# Patient Record
Sex: Female | Born: 1955
Health system: Southern US, Community
[De-identification: ages and names within clinical notes are randomized; demographics above are authoritative.]

## PROBLEM LIST (undated history)

## (undated) DIAGNOSIS — M79671 Pain in right foot: Principal | ICD-10-CM

## (undated) DIAGNOSIS — I10 Essential (primary) hypertension: Secondary | ICD-10-CM

## (undated) DIAGNOSIS — E785 Hyperlipidemia, unspecified: Secondary | ICD-10-CM

## (undated) DIAGNOSIS — K635 Polyp of colon: Secondary | ICD-10-CM

## (undated) DIAGNOSIS — H3320 Serous retinal detachment, unspecified eye: Secondary | ICD-10-CM

## (undated) DIAGNOSIS — T368X5A Adverse effect of other systemic antibiotics, initial encounter: Secondary | ICD-10-CM

## (undated) DIAGNOSIS — E669 Obesity, unspecified: Secondary | ICD-10-CM

## (undated) DIAGNOSIS — R739 Hyperglycemia, unspecified: Secondary | ICD-10-CM

## (undated) DIAGNOSIS — N179 Acute kidney failure, unspecified: Secondary | ICD-10-CM

## (undated) DIAGNOSIS — N141 Nephropathy induced by other drugs, medicaments and biological substances: Secondary | ICD-10-CM

## (undated) HISTORY — DX: Pain in right foot: M79.671

## (undated) HISTORY — PX: BREAST BIOPSY: SHX20

## (undated) HISTORY — DX: Nephropathy induced by other drugs, medicaments and biological substances: N14.1

## (undated) HISTORY — DX: Adverse effect of other systemic antibiotics, initial encounter: T36.8X5A

## (undated) HISTORY — DX: Obesity, unspecified: E66.9

## (undated) HISTORY — DX: Serous retinal detachment, unspecified eye: H33.20

## (undated) HISTORY — PX: TONSILLECTOMY: SHX5217

## (undated) HISTORY — DX: Acute kidney failure, unspecified: N17.9

## (undated) HISTORY — DX: Polyp of colon: K63.5

## (undated) HISTORY — DX: Hyperlipidemia, unspecified: E78.5

## (undated) HISTORY — DX: Essential (primary) hypertension: I10

## (undated) HISTORY — PX: EYE SURGERY: SHX253

## (undated) HISTORY — DX: Hyperglycemia, unspecified: R73.9

---

## 1998-08-04 ENCOUNTER — Other Ambulatory Visit: Admission: RE | Admit: 1998-08-04 | Discharge: 1998-08-04 | Payer: Self-pay | Admitting: *Deleted

## 1998-09-10 ENCOUNTER — Encounter (INDEPENDENT_AMBULATORY_CARE_PROVIDER_SITE_OTHER): Payer: Self-pay | Admitting: Specialist

## 1998-09-10 ENCOUNTER — Ambulatory Visit (HOSPITAL_COMMUNITY): Admission: RE | Admit: 1998-09-10 | Discharge: 1998-09-10 | Payer: Self-pay | Admitting: Gastroenterology

## 1999-07-11 ENCOUNTER — Encounter: Payer: Self-pay | Admitting: Surgery

## 1999-07-11 ENCOUNTER — Encounter: Admission: RE | Admit: 1999-07-11 | Discharge: 1999-07-11 | Payer: Self-pay | Admitting: Surgery

## 1999-07-11 ENCOUNTER — Encounter (INDEPENDENT_AMBULATORY_CARE_PROVIDER_SITE_OTHER): Payer: Self-pay | Admitting: *Deleted

## 1999-07-11 ENCOUNTER — Other Ambulatory Visit: Admission: RE | Admit: 1999-07-11 | Discharge: 1999-07-11 | Payer: Self-pay | Admitting: Surgery

## 2000-01-05 ENCOUNTER — Encounter: Admission: RE | Admit: 2000-01-05 | Discharge: 2000-01-05 | Payer: Self-pay | Admitting: Family Medicine

## 2000-01-05 ENCOUNTER — Encounter: Payer: Self-pay | Admitting: Family Medicine

## 2000-07-02 ENCOUNTER — Encounter: Payer: Self-pay | Admitting: *Deleted

## 2000-07-02 ENCOUNTER — Encounter: Admission: RE | Admit: 2000-07-02 | Discharge: 2000-07-02 | Payer: Self-pay | Admitting: *Deleted

## 2001-07-05 ENCOUNTER — Encounter: Admission: RE | Admit: 2001-07-05 | Discharge: 2001-07-05 | Payer: Self-pay | Admitting: Family Medicine

## 2001-07-05 ENCOUNTER — Encounter: Payer: Self-pay | Admitting: Family Medicine

## 2001-12-27 ENCOUNTER — Ambulatory Visit (HOSPITAL_COMMUNITY): Admission: RE | Admit: 2001-12-27 | Discharge: 2001-12-27 | Payer: Self-pay | Admitting: Gastroenterology

## 2001-12-27 ENCOUNTER — Encounter (INDEPENDENT_AMBULATORY_CARE_PROVIDER_SITE_OTHER): Payer: Self-pay | Admitting: Specialist

## 2002-07-08 ENCOUNTER — Encounter: Admission: RE | Admit: 2002-07-08 | Discharge: 2002-07-08 | Payer: Self-pay | Admitting: Family Medicine

## 2002-07-08 ENCOUNTER — Encounter: Payer: Self-pay | Admitting: Family Medicine

## 2003-07-09 ENCOUNTER — Encounter: Admission: RE | Admit: 2003-07-09 | Discharge: 2003-07-09 | Payer: Self-pay | Admitting: Family Medicine

## 2003-12-09 ENCOUNTER — Other Ambulatory Visit: Admission: RE | Admit: 2003-12-09 | Discharge: 2003-12-09 | Payer: Self-pay | Admitting: *Deleted

## 2004-07-15 ENCOUNTER — Encounter: Admission: RE | Admit: 2004-07-15 | Discharge: 2004-07-15 | Payer: Self-pay | Admitting: Family Medicine

## 2005-03-17 ENCOUNTER — Other Ambulatory Visit: Admission: RE | Admit: 2005-03-17 | Discharge: 2005-03-17 | Payer: Self-pay | Admitting: *Deleted

## 2005-07-17 ENCOUNTER — Encounter: Admission: RE | Admit: 2005-07-17 | Discharge: 2005-07-17 | Payer: Self-pay | Admitting: Family Medicine

## 2006-05-02 ENCOUNTER — Other Ambulatory Visit: Admission: RE | Admit: 2006-05-02 | Discharge: 2006-05-02 | Payer: Self-pay | Admitting: *Deleted

## 2006-08-01 ENCOUNTER — Encounter: Admission: RE | Admit: 2006-08-01 | Discharge: 2006-08-01 | Payer: Self-pay | Admitting: Family Medicine

## 2007-05-15 LAB — HM COLONOSCOPY

## 2007-08-08 ENCOUNTER — Encounter: Admission: RE | Admit: 2007-08-08 | Discharge: 2007-08-08 | Payer: Self-pay | Admitting: Family Medicine

## 2007-08-08 ENCOUNTER — Other Ambulatory Visit: Admission: RE | Admit: 2007-08-08 | Discharge: 2007-08-08 | Payer: Self-pay | Admitting: Family Medicine

## 2008-06-23 DIAGNOSIS — H3320 Serous retinal detachment, unspecified eye: Secondary | ICD-10-CM

## 2008-06-23 HISTORY — DX: Serous retinal detachment, unspecified eye: H33.20

## 2008-09-09 ENCOUNTER — Encounter: Admission: RE | Admit: 2008-09-09 | Discharge: 2008-09-09 | Payer: Self-pay | Admitting: Family Medicine

## 2008-12-30 ENCOUNTER — Other Ambulatory Visit: Admission: RE | Admit: 2008-12-30 | Discharge: 2008-12-30 | Payer: Self-pay | Admitting: Family Medicine

## 2009-10-29 ENCOUNTER — Encounter: Admission: RE | Admit: 2009-10-29 | Discharge: 2009-10-29 | Payer: Self-pay | Admitting: Family Medicine

## 2010-06-03 NOTE — Op Note (Signed)
   NAME:  Lori Swanson, Lori Swanson                        ACCOUNT NO.:  [id number]   MEDICAL RECORD NO.:  [id number]                   PATIENT TYPE:  AMB   LOCATION:  ENDO                                 FACILITY:  [facility]   PHYSICIAN:  James L. Malon Kindle., M.D.          DATE OF BIRTH:  1955/10/11   DATE OF PROCEDURE:  12/27/2001  DATE OF DISCHARGE:                                 OPERATIVE REPORT   PROCEDURE:  Colonoscopy.   MEDICATIONS:  Fentanyl 80 mcg, Versed 8 mg intravenous.   SCOPE:  Olympus pediatric colonoscope.   INDICATIONS:  Previous history of adenomatous colon polyps.  This is done as  a three-year followup.   DESCRIPTION OF PROCEDURE:  The procedure had been explained to the patient  and consent obtained.  With the patient in the left lateral decubitus  position the scope was inserted and advanced under direct visualization.  The prep was quite good and we were able to advance easily to the cecum.  The ileocecal valve and appendiceal orifice were seen.  The scope was  withdrawn in the cecum, ascending colon, transverse colon, descending and  sigmoid colon were seen well.  In the mid sigmoid colon at 30 centimeters  from the anal verge was a reddened area.  It could have been a slight  colitis and for this reason we went ahead and biopsied.  There was no  significant diverticular disease there.  We did not see any polyps.  The  scope was withdrawn.  The distal sigmoid and rectum were free of polyps.  The patient tolerated the procedure well and was maintained on low flow  oxygen and pulse oximeter throughout the procedure.   ASSESSMENT:  1. No evidence of further colon polyps.  2. Reddened area at the sigmoid, probably insignificant.   PLAN:  We will check pathology report.  Recommend repeating in five years.  Check yearly hemoccults.                                                  James L. Malon Kindle., M.D.    Waldron Session  D:  12/27/2001  T:  12/27/2001  Job:   [id number]   cc:   C. Duane Lope, M.D.  [address]  [place]  [place] [address]  Fax: [phone]

## 2010-12-13 ENCOUNTER — Other Ambulatory Visit: Payer: Self-pay | Admitting: Family Medicine

## 2010-12-13 DIAGNOSIS — Z1231 Encounter for screening mammogram for malignant neoplasm of breast: Secondary | ICD-10-CM

## 2011-01-05 ENCOUNTER — Ambulatory Visit
Admission: RE | Admit: 2011-01-05 | Discharge: 2011-01-05 | Disposition: A | Discharge status: Home / Self Care | Payer: PRIVATE HEALTH INSURANCE | Source: Ambulatory Visit | Attending: Family Medicine | Admitting: Family Medicine

## 2011-01-05 DIAGNOSIS — Z1231 Encounter for screening mammogram for malignant neoplasm of breast: Secondary | ICD-10-CM

## 2011-09-27 LAB — HM PAP SMEAR: HM Pap smear: NORMAL

## 2011-09-29 DIAGNOSIS — Z961 Presence of intraocular lens: Secondary | ICD-10-CM | POA: Insufficient documentation

## 2012-02-14 ENCOUNTER — Other Ambulatory Visit: Payer: Self-pay | Admitting: Family Medicine

## 2012-02-14 DIAGNOSIS — Z1231 Encounter for screening mammogram for malignant neoplasm of breast: Secondary | ICD-10-CM

## 2012-02-29 ENCOUNTER — Ambulatory Visit: Payer: PRIVATE HEALTH INSURANCE

## 2012-03-06 ENCOUNTER — Ambulatory Visit
Admission: RE | Admit: 2012-03-06 | Discharge: 2012-03-06 | Disposition: A | Discharge status: Home / Self Care | Payer: BC Managed Care – PPO | Source: Ambulatory Visit | Attending: Family Medicine | Admitting: Family Medicine

## 2012-03-06 DIAGNOSIS — Z1231 Encounter for screening mammogram for malignant neoplasm of breast: Secondary | ICD-10-CM

## 2012-04-04 ENCOUNTER — Telehealth: Payer: Self-pay | Admitting: Physician Assistant

## 2012-04-04 DIAGNOSIS — E785 Hyperlipidemia, unspecified: Secondary | ICD-10-CM

## 2012-04-04 NOTE — Telephone Encounter (Signed)
Unsure what cholesterol medication patient is on.  Chart says Simvastatin and pt asking for Crestor.  Have left her a mess to call back so we can clarify correct medication.

## 2012-04-05 MED ORDER — SIMVASTATIN 10 MG PO TABS: 10.0000 mg | ORAL_TABLET | Freq: Every day | ORAL | Status: DC

## 2012-04-05 NOTE — Telephone Encounter (Signed)
Patient called back and verified that yes she is taking Simvastatin not Crestor.  Did not have bottle in front of her when she originally called.  Pt told refill will be sent.  Reminded her she is due for follow up visit and lab work.  She is currently out of town and will call for appt when she returns.

## 2012-08-28 ENCOUNTER — Other Ambulatory Visit: Payer: BC Managed Care – PPO

## 2012-08-28 DIAGNOSIS — Z79899 Other long term (current) drug therapy: Secondary | ICD-10-CM

## 2012-08-28 DIAGNOSIS — E785 Hyperlipidemia, unspecified: Secondary | ICD-10-CM

## 2012-08-28 DIAGNOSIS — I10 Essential (primary) hypertension: Secondary | ICD-10-CM

## 2012-08-28 LAB — CBC WITH DIFFERENTIAL/PLATELET
Basophils Absolute: 0 10*3/uL (ref 0.0–0.1)
Basophils Relative: 1 % (ref 0–1)
Eosinophils Absolute: 0.2 10*3/uL (ref 0.0–0.7)
Eosinophils Relative: 4 % (ref 0–5)
HCT: 42.1 % (ref 36.0–46.0)
Hemoglobin: 14.1 g/dL (ref 12.0–15.0)
Lymphocytes Relative: 43 % (ref 12–46)
Lymphs Abs: 1.9 10*3/uL (ref 0.7–4.0)
MCH: 29.7 pg (ref 26.0–34.0)
MCHC: 33.5 g/dL (ref 30.0–36.0)
MCV: 88.6 fL (ref 78.0–100.0)
Monocytes Absolute: 0.4 10*3/uL (ref 0.1–1.0)
Monocytes Relative: 9 % (ref 3–12)
Neutro Abs: 1.9 10*3/uL (ref 1.7–7.7)
Neutrophils Relative %: 43 % (ref 43–77)
Platelets: 311 10*3/uL (ref 150–400)
RBC: 4.75 MIL/uL (ref 3.87–5.11)
RDW: 13.6 % (ref 11.5–15.5)
WBC: 4.3 10*3/uL (ref 4.0–10.5)

## 2012-08-28 LAB — LIPID PANEL
Cholesterol: 200 mg/dL (ref 0–200)
HDL: 39 mg/dL — ABNORMAL LOW (ref >39)
LDL Cholesterol: 117 mg/dL — ABNORMAL HIGH (ref 0–99)
Total CHOL/HDL Ratio: 5.1 Ratio
Triglycerides: 222 mg/dL — ABNORMAL HIGH (ref <150)
VLDL: 44 mg/dL — ABNORMAL HIGH (ref 0–40)

## 2012-08-28 LAB — COMPREHENSIVE METABOLIC PANEL
ALT: 14 U/L (ref 0–35)
AST: 15 U/L (ref 0–37)
Albumin: 4.5 g/dL (ref 3.5–5.2)
Alkaline Phosphatase: 55 U/L (ref 39–117)
BUN: 11 mg/dL (ref 6–23)
CO2: 27 mEq/L (ref 19–32)
Calcium: 9.6 mg/dL (ref 8.4–10.5)
Chloride: 101 mEq/L (ref 96–112)
Creat: 0.72 mg/dL (ref 0.50–1.10)
Glucose, Bld: 113 mg/dL — ABNORMAL HIGH (ref 70–99)
Potassium: 4.1 mEq/L (ref 3.5–5.3)
Sodium: 139 mEq/L (ref 135–145)
Total Bilirubin: 0.7 mg/dL (ref 0.3–1.2)
Total Protein: 6.7 g/dL (ref 6.0–8.3)

## 2012-09-02 ENCOUNTER — Encounter: Payer: Self-pay | Admitting: Physician Assistant

## 2012-09-02 ENCOUNTER — Ambulatory Visit (INDEPENDENT_AMBULATORY_CARE_PROVIDER_SITE_OTHER): Payer: BC Managed Care – PPO | Admitting: Physician Assistant

## 2012-09-02 VITALS — BP 128/80 | HR 68 | Temp 97.7°F | Resp 18 | Ht 61.75 in | Wt 263.0 lb

## 2012-09-02 DIAGNOSIS — R739 Hyperglycemia, unspecified: Secondary | ICD-10-CM | POA: Insufficient documentation

## 2012-09-02 DIAGNOSIS — E669 Obesity, unspecified: Secondary | ICD-10-CM

## 2012-09-02 DIAGNOSIS — E785 Hyperlipidemia, unspecified: Secondary | ICD-10-CM | POA: Insufficient documentation

## 2012-09-02 DIAGNOSIS — R7309 Other abnormal glucose: Secondary | ICD-10-CM

## 2012-09-02 DIAGNOSIS — I10 Essential (primary) hypertension: Secondary | ICD-10-CM | POA: Insufficient documentation

## 2012-09-02 MED ORDER — VALSARTAN-HYDROCHLOROTHIAZIDE 320-25 MG PO TABS: 1.0000 | ORAL_TABLET | Freq: Every day | ORAL | Status: DC

## 2012-09-02 MED ORDER — SIMVASTATIN 10 MG PO TABS: 10.0000 mg | ORAL_TABLET | Freq: Every day | ORAL | Status: DC

## 2012-09-02 NOTE — Progress Notes (Signed)
Patient ID: Lori Swanson MRN: [id number], DOB: Dec 23, 1955, 57 y.o. Date of Encounter: @DATE @  Chief Complaint:  Chief Complaint  Patient presents with  . routine check up for med refills    HPI: 57 y.o. year old white female  presents for routine f/u OV. She came last week and had labs done. She has stopped her chol med 6 weeks ago b/c left leg was feeling stiff. No other areas of myalgias-just left leg.  She has been doing [employer] for one month.  Still exercising-3 days/ week-exercises at 5 a.m. -walks or goes to gym.  No other complaints. Taking BP med as directed. No adv effects.    Past Medical History  Diagnosis Date  . Hyperlipidemia   . Hypertension   . Obesity   . Hyperglycemia      Home Meds: See attached medication section for current medication list. Any medications entered into computer today will not appear on this note's list. The medications listed below were entered prior to today. Current Outpatient Prescriptions on File Prior to Visit  Medication Sig Dispense Refill  . Ascorbic Acid (VITAMIN C) 1000 MG tablet Take 1,000 mg by mouth daily.      . Multiple Vitamin (MULTIVITAMIN) capsule Take 1 capsule by mouth daily. Takes a Centrum MVI daily      . Omega-3 Fatty Acids (FISH OIL) 1200 MG CAPS Take 2 capsules by mouth every morning.      . vitamin E 400 UNIT capsule Take 400 Units by mouth daily.      Marland Kitchen UNABLE TO FIND Fluoromethrolone 0.1% eye drops       No current facility-administered medications on file prior to visit.    Allergies:  Allergies  Allergen Reactions  . Iodine Swelling    History   Social History  . Marital Status: Single    Spouse Name: N/A    Number of Children: N/A  . Years of Education: N/A   Occupational History  . Not on file.   Social History Main Topics  . Smoking status: Never Smoker   . Smokeless tobacco: Never Used  . Alcohol Use: No  . Drug Use: No  . Sexual Activity: Not on file   Other Topics Concern   . Not on file   Social History Narrative  . No narrative on file    History reviewed. No pertinent family history.   Review of Systems:  See HPI for pertinent ROS. All other ROS negative.    Physical Exam: Blood pressure 128/80, pulse 68, temperature 97.7 F (36.5 C), temperature source Oral, resp. rate 18, height 5' 1.75" (1.568 m), weight 263 lb (119.296 kg)., Body mass index is 48.52 kg/(m^2). General: [name]. Appears in no acute distress. Neck: Supple. No thyromegaly. No lymphadenopathy.No carotid bruits. Lungs: Clear bilaterally to auscultation without wheezes, rales, or rhonchi. Breathing is unlabored. Heart: RRR with S1 S2. No murmurs, rubs, or gallops. Abdomen: Soft, non-tender, non-distended with normoactive bowel sounds. No hepatomegaly. No rebound/guarding. No obvious abdominal masses. Musculoskeletal:  Strength and tone normal for age. Extremities/Skin: Warm and dry. No edema. Neuro: Alert and oriented X 3. Moves all extremities spontaneously. Gait is normal. CNII-XII grossly in tact. Psych:  Responds to questions appropriately with a normal affect.   Results for orders placed in visit on 08/28/12  CBC WITH DIFFERENTIAL      Result Value Range   WBC 4.3  4.0 - 10.5 K/uL   RBC 4.75  3.87 - 5.11 MIL/uL  Hemoglobin 14.1  12.0 - 15.0 g/dL   HCT 11.9  14.7 - 82.9 %   MCV 88.6  78.0 - 100.0 fL   MCH 29.7  26.0 - 34.0 pg   MCHC 33.5  30.0 - 36.0 g/dL   RDW 56.2  13.0 - 86.5 %   Platelets 311  150 - 400 K/uL   Neutrophils Relative % 43  43 - 77 %   Neutro Abs 1.9  1.7 - 7.7 K/uL   Lymphocytes Relative 43  12 - 46 %   Lymphs Abs 1.9  0.7 - 4.0 K/uL   Monocytes Relative 9  3 - 12 %   Monocytes Absolute 0.4  0.1 - 1.0 K/uL   Eosinophils Relative 4  0 - 5 %   Eosinophils Absolute 0.2  0.0 - 0.7 K/uL   Basophils Relative 1  0 - 1 %   Basophils Absolute 0.0  0.0 - 0.1 K/uL   Smear Review Criteria for review not met    COMPREHENSIVE METABOLIC PANEL      Result Value  Range   Sodium 139  135 - 145 mEq/L   Potassium 4.1  3.5 - 5.3 mEq/L   Chloride 101  96 - 112 mEq/L   CO2 27  19 - 32 mEq/L   Glucose, Bld 113 (*) 70 - 99 mg/dL   BUN 11  6 - 23 mg/dL   Creat 7.84  6.96 - 2.95 mg/dL   Total Bilirubin 0.7  0.3 - 1.2 mg/dL   Alkaline Phosphatase 55  39 - 117 U/L   AST 15  0 - 37 U/L   ALT 14  0 - 35 U/L   Total Protein 6.7  6.0 - 8.3 g/dL   Albumin 4.5  3.5 - 5.2 g/dL   Calcium 9.6  8.4 - 28.4 mg/dL  LIPID PANEL      Result Value Range   Cholesterol 200  0 - 200 mg/dL   Triglycerides 132 (*) <150 mg/dL   HDL 39 (*) >44 mg/dL   Total CHOL/HDL Ratio 5.1     VLDL 44 (*) 0 - 40 mg/dL   LDL Cholesterol 010 (*) 0 - 99 mg/dL     ASSESSMENT AND PLAN:  57 y.o. year old female with  1. Hyperglycemia Fasting Glucose above 113, stable from prior checks. Cont [employer]. THis should improve her BS.  2. Hyperlipidemia Discussed that cholesterol not that high, even off med and that [employer] should improve her cholesterol. However, we discussed that I'm not sure the Simvastatin was the cause of her left leg stiffness. She wants to try med again. IF she develops myalgias, she is to stop med and call usso can document this.  - simvastatin (ZOCOR) 10 MG tablet; Take 1 tablet (10 mg total) by mouth at bedtime.  Dispense: 90 tablet; Refill: 1  3. Hypertension At Goal. Cont current med.  - valsartan-hydrochlorothiazide (DIOVAN-HCT) 320-25 MG per tablet; Take 1 tablet by mouth daily.  Dispense: 30 tablet; Refill: 5  4. Obesity Cont Wt. Watchers and exercise.   PREVENTIVE CARE: SEE CPE 09/2011.  [address] [place], [place], [facility] 09/02/2012 8:37 AM

## 2012-09-05 ENCOUNTER — Encounter: Payer: Self-pay | Admitting: Family Medicine

## 2013-02-21 ENCOUNTER — Other Ambulatory Visit: Payer: Self-pay | Admitting: Physician Assistant

## 2013-02-21 NOTE — Telephone Encounter (Signed)
CPE later this month.  One month refills sent

## 2013-03-06 ENCOUNTER — Other Ambulatory Visit: Payer: BC Managed Care – PPO | Admitting: Physician Assistant

## 2013-03-24 ENCOUNTER — Other Ambulatory Visit: Payer: Self-pay | Admitting: Physician Assistant

## 2013-03-24 DIAGNOSIS — I10 Essential (primary) hypertension: Secondary | ICD-10-CM

## 2013-03-24 DIAGNOSIS — E785 Hyperlipidemia, unspecified: Secondary | ICD-10-CM

## 2013-03-25 ENCOUNTER — Encounter: Payer: Self-pay | Admitting: Family Medicine

## 2013-03-25 NOTE — Telephone Encounter (Signed)
Medication refill for one time only.  Patient needs to be seen.  Letter sent for patient to call and schedule 

## 2013-04-29 ENCOUNTER — Encounter: Payer: Self-pay | Admitting: Family Medicine

## 2013-04-29 ENCOUNTER — Other Ambulatory Visit: Payer: BC Managed Care – PPO

## 2013-04-29 DIAGNOSIS — Z79899 Other long term (current) drug therapy: Secondary | ICD-10-CM

## 2013-04-29 DIAGNOSIS — R739 Hyperglycemia, unspecified: Secondary | ICD-10-CM

## 2013-04-29 DIAGNOSIS — Z0000 Encounter for general adult medical examination without abnormal findings: Secondary | ICD-10-CM

## 2013-04-29 DIAGNOSIS — E785 Hyperlipidemia, unspecified: Secondary | ICD-10-CM

## 2013-04-29 LAB — LIPID PANEL
Cholesterol: 162 mg/dL (ref 0–200)
HDL: 37 mg/dL — ABNORMAL LOW (ref >39)
LDL Cholesterol: 89 mg/dL (ref 0–99)
Total CHOL/HDL Ratio: 4.4 Ratio
Triglycerides: 178 mg/dL — ABNORMAL HIGH (ref <150)
VLDL: 36 mg/dL (ref 0–40)

## 2013-04-29 LAB — CBC WITH DIFFERENTIAL/PLATELET
Basophils Absolute: 0.1 10*3/uL (ref 0.0–0.1)
Basophils Relative: 1 % (ref 0–1)
Eosinophils Absolute: 0.2 10*3/uL (ref 0.0–0.7)
Eosinophils Relative: 2 % (ref 0–5)
HCT: 41.7 % (ref 36.0–46.0)
Hemoglobin: 14 g/dL (ref 12.0–15.0)
Lymphocytes Relative: 23 % (ref 12–46)
Lymphs Abs: 2 10*3/uL (ref 0.7–4.0)
MCH: 30 pg (ref 26.0–34.0)
MCHC: 33.6 g/dL (ref 30.0–36.0)
MCV: 89.5 fL (ref 78.0–100.0)
Monocytes Absolute: 0.5 10*3/uL (ref 0.1–1.0)
Monocytes Relative: 6 % (ref 3–12)
Neutro Abs: 5.8 10*3/uL (ref 1.7–7.7)
Neutrophils Relative %: 68 % (ref 43–77)
Platelets: 292 10*3/uL (ref 150–400)
RBC: 4.66 MIL/uL (ref 3.87–5.11)
RDW: 13.7 % (ref 11.5–15.5)
WBC: 8.5 10*3/uL (ref 4.0–10.5)

## 2013-04-29 LAB — COMPLETE METABOLIC PANEL WITH GFR
ALT: 14 U/L (ref 0–35)
AST: 15 U/L (ref 0–37)
Albumin: 4.3 g/dL (ref 3.5–5.2)
Alkaline Phosphatase: 52 U/L (ref 39–117)
BUN: 13 mg/dL (ref 6–23)
CO2: 26 mEq/L (ref 19–32)
Calcium: 9.4 mg/dL (ref 8.4–10.5)
Chloride: 103 mEq/L (ref 96–112)
Creat: 0.66 mg/dL (ref 0.50–1.10)
GFR, Est African American: >89 mL/min
GFR, Est Non African American: >89 mL/min
Glucose, Bld: 112 mg/dL — ABNORMAL HIGH (ref 70–99)
Potassium: 4.2 mEq/L (ref 3.5–5.3)
Sodium: 139 mEq/L (ref 135–145)
Total Bilirubin: 0.6 mg/dL (ref 0.2–1.2)
Total Protein: 6.5 g/dL (ref 6.0–8.3)

## 2013-04-29 LAB — HEMOGLOBIN A1C
Hgb A1c MFr Bld: 6 % — ABNORMAL HIGH (ref <5.7)
Mean Plasma Glucose: 126 mg/dL — ABNORMAL HIGH (ref <117)

## 2013-05-01 ENCOUNTER — Encounter: Payer: Self-pay | Admitting: Physician Assistant

## 2013-05-01 ENCOUNTER — Ambulatory Visit (INDEPENDENT_AMBULATORY_CARE_PROVIDER_SITE_OTHER): Payer: BC Managed Care – PPO | Admitting: Physician Assistant

## 2013-05-01 ENCOUNTER — Other Ambulatory Visit: Payer: Self-pay | Admitting: Family Medicine

## 2013-05-01 VITALS — BP 142/84 | HR 64 | Temp 97.9°F | Resp 18 | Ht 62.75 in | Wt 269.0 lb

## 2013-05-01 DIAGNOSIS — Z79899 Other long term (current) drug therapy: Secondary | ICD-10-CM

## 2013-05-01 DIAGNOSIS — E785 Hyperlipidemia, unspecified: Secondary | ICD-10-CM

## 2013-05-01 DIAGNOSIS — I10 Essential (primary) hypertension: Secondary | ICD-10-CM

## 2013-05-01 DIAGNOSIS — R7309 Other abnormal glucose: Secondary | ICD-10-CM

## 2013-05-01 DIAGNOSIS — E669 Obesity, unspecified: Secondary | ICD-10-CM

## 2013-05-01 DIAGNOSIS — R739 Hyperglycemia, unspecified: Secondary | ICD-10-CM

## 2013-05-01 MED ORDER — VALSARTAN-HYDROCHLOROTHIAZIDE 320-25 MG PO TABS: 1.0000 | ORAL_TABLET | Freq: Every day | ORAL | Status: DC

## 2013-05-01 MED ORDER — SIMVASTATIN 10 MG PO TABS: 10.0000 mg | ORAL_TABLET | Freq: Every day | ORAL | Status: DC

## 2013-05-01 NOTE — Progress Notes (Signed)
Patient ID: Kayleen MemosCarrie E Voong MRN: [id number], DOB: January 09, 1956, 58 y.o. Date of Encounter: @DATE @  Chief Complaint:  Chief Complaint  Patient presents with  . 6 mth check up/med RF    has had labs    HPI: 58 y.o. year old white female  presents for routine f/u OV. She came last week and had labs done.    At the last office visit 09/02/12 she reported that she had been doing [employer] for one month.  Today she states that she continued that for about 3 months. And had to stop it secondary to finances. Says that she feels like she has continued a lot of the changes that she had learned and developed while doing the [employer]. States that she thinks her weight is up a little bit today just secondary to the winter. Says that she feels like with sprain here she will lose some weight and it back in line with that.  At her last visit 08/2012 she was exercising 3 days/ week-exercises at 5 a.m. -walks or goes to gym.  ToDay she reports that she is still exercising 3 days per week but is doing it in the evenings instead of the mornings. Still, she either walks outside or goes to the gym.  No  complaints. Taking BP med  And chol med as directed. No adv effects.    Past Medical History  Diagnosis Date  . Hyperlipidemia   . Hypertension   . Obesity   . Hyperglycemia   . Detached retina 06/23/2008  . Colon polyps      Home Meds: See attached medication section for current medication list. Any medications entered into computer today will not appear on this note's list. The medications listed below were entered prior to today. Current Outpatient Prescriptions on File Prior to Visit  Medication Sig Dispense Refill  . Ascorbic Acid (VITAMIN C) 1000 MG tablet Take 1,000 mg by mouth daily.      Marland Kitchen. aspirin 81 MG tablet Take 81 mg by mouth daily.      . Multiple Vitamin (MULTIVITAMIN) capsule Take 1 capsule by mouth daily. Takes a Centrum MVI daily      . Omega-3 Fatty Acids (FISH OIL) 1200 MG  CAPS Take 2 capsules by mouth every morning.      . simvastatin (ZOCOR) 10 MG tablet TAKE 1 TABLET AT BEDTIME  30 tablet  0  . valsartan-hydrochlorothiazide (DIOVAN-HCT) 320-25 MG per tablet TAKE 1 TABLET BY MOUTH EVERY DAY  30 tablet  0  . vitamin E 400 UNIT capsule Take 400 Units by mouth daily.       No current facility-administered medications on file prior to visit.    Allergies:  Allergies  Allergen Reactions  . Iodine Swelling    History   Social History  . Marital Status: Single    Spouse Name: N/A    Number of Children: N/A  . Years of Education: N/A   Occupational History  . Not on file.   Social History Main Topics  . Smoking status: Never Smoker   . Smokeless tobacco: Never Used  . Alcohol Use: No  . Drug Use: No  . Sexual Activity: Not on file   Other Topics Concern  . Not on file   Social History Narrative  . No narrative on file    Family History  Problem Relation Age of Onset  . Diabetes Mother   . Heart disease Mother     MI, CAD  .  Cancer Mother     breast  . Hypertension Mother   . Cancer Maternal Uncle     throat  . Cancer Maternal Uncle     bone  . Cancer Father     lung  . Hypertension Father   . Heart disease Father     pacemaker, MI in 8050's     Review of Systems:  See HPI for pertinent ROS. All other ROS negative.    Physical Exam: Blood pressure 142/84, pulse 64, temperature 97.9 F (36.6 C), temperature source Oral, resp. rate 18, height 5' 2.75" (1.594 m), weight 269 lb (122.018 kg)., Body mass index is 48.02 kg/(m^2). General: [name]. Appears in no acute distress. Neck: Supple. No thyromegaly. No lymphadenopathy.No carotid bruits. Lungs: Clear bilaterally to auscultation without wheezes, rales, or rhonchi. Breathing is unlabored. Heart: RRR with S1 S2. No murmurs, rubs, or gallops. Abdomen: Soft, non-tender, non-distended with normoactive bowel sounds. No hepatomegaly. No rebound/guarding. No obvious abdominal  masses. Musculoskeletal:  Strength and tone normal for age. Extremities/Skin: Warm and dry. No edema. Neuro: Alert and oriented X 3. Moves all extremities spontaneously. Gait is normal. CNII-XII grossly in tact. Psych:  Responds to questions appropriately with a normal affect.   Results for orders placed in visit on 04/29/13  HM PAP SMEAR      Result Value Ref Range   HM Pap smear normal    HM COLONOSCOPY      Result Value Ref Range   HM Colonoscopy polyps       ASSESSMENT AND PLAN:  58 y.o. year old female with  1. Hyperglycemia SHe recently came and have fasting labs drawn.   A1C is 6.0. Continue diet and exercise to control this.  2. Hyperlipidemia She recently had FLP/ LFT. This was excellent. LDL is at 89 which is at goal. LFTs normal. Continue current cholesterol medication recheck 6 months.  3. Hypertension At Goal. Cont current med.  - valsartan-hydrochlorothiazide (DIOVAN-HCT) 320-25 MG per tablet; Take 1 tablet by mouth daily.  Dispense: 30 tablet; Refill: 5  4. Obesity Cont Diet and exercise.   PREVENTIVE CARE: SEE CPE 09/2011.  [address]igned, [place], [place], [facility] 05/01/2013 9:47 AM

## 2013-05-31 ENCOUNTER — Other Ambulatory Visit: Payer: Self-pay | Admitting: Physician Assistant

## 2013-06-02 NOTE — Telephone Encounter (Signed)
Medication refilled per protocol. 

## 2014-05-07 NOTE — Progress Notes (Signed)
Pt showing up in my [employer] Results Folder.  LOV 04/2013.  Call pt and have her schedule follow-up office visit. If possible, come fasting to that visit.

## 2014-05-29 ENCOUNTER — Other Ambulatory Visit: Payer: Self-pay | Admitting: Physician Assistant

## 2014-05-29 ENCOUNTER — Encounter: Payer: Self-pay | Admitting: Family Medicine

## 2014-05-29 NOTE — Telephone Encounter (Signed)
Pt has not been seen in over one year. Medication refill for one time only.  Patient needs to be seen.  Letter sent for patient to call and schedule 

## 2014-06-26 ENCOUNTER — Other Ambulatory Visit: Payer: Self-pay | Admitting: Physician Assistant

## 2014-07-09 ENCOUNTER — Encounter: Payer: Self-pay | Admitting: Physician Assistant

## 2014-07-09 ENCOUNTER — Ambulatory Visit (INDEPENDENT_AMBULATORY_CARE_PROVIDER_SITE_OTHER): Payer: BLUE CROSS/BLUE SHIELD | Admitting: Physician Assistant

## 2014-07-09 VITALS — BP 138/88 | HR 100 | Temp 97.4°F | Resp 20 | Wt 274.0 lb

## 2014-07-09 DIAGNOSIS — M25474 Effusion, right foot: Secondary | ICD-10-CM

## 2014-07-09 DIAGNOSIS — M7989 Other specified soft tissue disorders: Secondary | ICD-10-CM

## 2014-07-09 DIAGNOSIS — I10 Essential (primary) hypertension: Secondary | ICD-10-CM | POA: Diagnosis not present

## 2014-07-09 DIAGNOSIS — E669 Obesity, unspecified: Secondary | ICD-10-CM | POA: Diagnosis not present

## 2014-07-09 DIAGNOSIS — R739 Hyperglycemia, unspecified: Secondary | ICD-10-CM

## 2014-07-09 DIAGNOSIS — E785 Hyperlipidemia, unspecified: Secondary | ICD-10-CM | POA: Diagnosis not present

## 2014-07-09 LAB — CBC WITH DIFFERENTIAL/PLATELET
Basophils Absolute: 0 10*3/uL (ref 0.0–0.1)
Basophils Relative: 0 % (ref 0–1)
Eosinophils Absolute: 0.1 10*3/uL (ref 0.0–0.7)
Eosinophils Relative: 1 % (ref 0–5)
HCT: 37.1 % (ref 36.0–46.0)
Hemoglobin: 12.2 g/dL (ref 12.0–15.0)
Lymphocytes Relative: 15 % (ref 12–46)
Lymphs Abs: 1.7 10*3/uL (ref 0.7–4.0)
MCH: 29.3 pg (ref 26.0–34.0)
MCHC: 32.9 g/dL (ref 30.0–36.0)
MCV: 89.2 fL (ref 78.0–100.0)
MPV: 8.7 fL (ref 8.6–12.4)
Monocytes Absolute: 0.7 10*3/uL (ref 0.1–1.0)
Monocytes Relative: 6 % (ref 3–12)
Neutro Abs: 9 10*3/uL — ABNORMAL HIGH (ref 1.7–7.7)
Neutrophils Relative %: 78 % — ABNORMAL HIGH (ref 43–77)
Platelets: 515 10*3/uL — ABNORMAL HIGH (ref 150–400)
RBC: 4.16 MIL/uL (ref 3.87–5.11)
RDW: 14.1 % (ref 11.5–15.5)
WBC: 11.6 10*3/uL — ABNORMAL HIGH (ref 4.0–10.5)

## 2014-07-09 LAB — COMPLETE METABOLIC PANEL WITH GFR
ALT: 20 U/L (ref 0–35)
AST: 13 U/L (ref 0–37)
Albumin: 3.2 g/dL — ABNORMAL LOW (ref 3.5–5.2)
Alkaline Phosphatase: 178 U/L — ABNORMAL HIGH (ref 39–117)
BUN: 13 mg/dL (ref 6–23)
CO2: 30 mEq/L (ref 19–32)
Calcium: 9 mg/dL (ref 8.4–10.5)
Chloride: 95 mEq/L — ABNORMAL LOW (ref 96–112)
Creat: 0.49 mg/dL — ABNORMAL LOW (ref 0.50–1.10)
GFR, Est African American: >89 mL/min
GFR, Est Non African American: >89 mL/min
Glucose, Bld: 115 mg/dL — ABNORMAL HIGH (ref 70–99)
Potassium: 3.5 mEq/L (ref 3.5–5.3)
Sodium: 140 mEq/L (ref 135–145)
Total Bilirubin: 0.5 mg/dL (ref 0.2–1.2)
Total Protein: 6.4 g/dL (ref 6.0–8.3)

## 2014-07-09 LAB — HEMOGLOBIN A1C
Hgb A1c MFr Bld: 6.2 % — ABNORMAL HIGH (ref <5.7)
Mean Plasma Glucose: 131 mg/dL — ABNORMAL HIGH (ref <117)

## 2014-07-09 LAB — LIPID PANEL
Cholesterol: 158 mg/dL (ref 0–200)
HDL: 22 mg/dL — ABNORMAL LOW (ref >=46)
LDL Cholesterol: 109 mg/dL — ABNORMAL HIGH (ref 0–99)
Total CHOL/HDL Ratio: 7.2 Ratio
Triglycerides: 134 mg/dL (ref <150)
VLDL: 27 mg/dL (ref 0–40)

## 2014-07-09 LAB — URIC ACID: Uric Acid, Serum: 4.3 mg/dL (ref 2.4–7.0)
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
## 2014-07-10 ENCOUNTER — Telehealth: Payer: Self-pay | Admitting: Family Medicine

## 2014-07-10 MED ORDER — CEPHALEXIN 500 MG PO CAPS: 500.0000 mg | ORAL_CAPSULE | Freq: Four times a day (QID) | ORAL | Status: DC

## 2014-07-10 NOTE — Telephone Encounter (Signed)
-----   Message from Dorena Bodo, PA-C sent at 07/10/2014 12:46 PM EDT ----- Lori Swanson patient was sent for venous ultrasound and x-ray. [name] did tell me that they called and said that the ultrasound was negative for DVT but she did not have the x-ray report.. Please obtain XRay report. IF XRay shows no abnormality, THEN treat with Keflex 500mg  1 po QID x 7 days #28 + 0 to treat as cellulitis (WBC and Neutrophils high). Tell pt to follow-up if foot worsens or is not improving after taking antibiotic for 48 hours. Call me on my cell if XRay shows ANYTHING.  Pt not been in for routine office visit in a long time. Tell her that those labs are stable right now but she needs to schedule a follow-up office visit to address these other issues further.

## 2014-07-10 NOTE — Telephone Encounter (Signed)
Pt aware of lab and imaging results.  Rx to pharmacy.  To call on Monday if foot not improving.  Reminded to call and schedule routine visit as well.

## 2014-07-14 ENCOUNTER — Inpatient Hospital Stay (HOSPITAL_COMMUNITY)
Admission: EM | Admit: 2014-07-14 | Discharge: 2014-07-16 | DRG: 552 | Disposition: A | Discharge status: Home / Self Care | Payer: BLUE CROSS/BLUE SHIELD | Attending: Orthopedic Surgery | Admitting: Orthopedic Surgery

## 2014-07-14 ENCOUNTER — Encounter (HOSPITAL_COMMUNITY): Payer: Self-pay | Admitting: Emergency Medicine

## 2014-07-14 DIAGNOSIS — Z7982 Long term (current) use of aspirin: Secondary | ICD-10-CM | POA: Diagnosis not present

## 2014-07-14 DIAGNOSIS — Z6841 Body Mass Index (BMI) 40.0 and over, adult: Secondary | ICD-10-CM | POA: Diagnosis not present

## 2014-07-14 DIAGNOSIS — E785 Hyperlipidemia, unspecified: Secondary | ICD-10-CM | POA: Diagnosis present

## 2014-07-14 DIAGNOSIS — L03115 Cellulitis of right lower limb: Secondary | ICD-10-CM | POA: Diagnosis not present

## 2014-07-14 DIAGNOSIS — M4642 Discitis, unspecified, cervical region: Secondary | ICD-10-CM | POA: Diagnosis present

## 2014-07-14 DIAGNOSIS — L03116 Cellulitis of left lower limb: Secondary | ICD-10-CM | POA: Diagnosis present

## 2014-07-14 DIAGNOSIS — I10 Essential (primary) hypertension: Secondary | ICD-10-CM | POA: Diagnosis present

## 2014-07-14 DIAGNOSIS — Z86010 Personal history of colonic polyps: Secondary | ICD-10-CM | POA: Diagnosis not present

## 2014-07-14 DIAGNOSIS — B9689 Other specified bacterial agents as the cause of diseases classified elsewhere: Secondary | ICD-10-CM | POA: Diagnosis not present

## 2014-07-14 LAB — COMPREHENSIVE METABOLIC PANEL
ALT: 26 U/L (ref 14–54)
AST: 15 U/L (ref 15–41)
Albumin: 3 g/dL — ABNORMAL LOW (ref 3.5–5.0)
Alkaline Phosphatase: 120 U/L (ref 38–126)
Anion gap: 7 (ref 5–15)
BUN: 15 mg/dL (ref 6–20)
CO2: 27 mmol/L (ref 22–32)
Calcium: 9.2 mg/dL (ref 8.9–10.3)
Chloride: 103 mmol/L (ref 101–111)
Creatinine, Ser: 0.71 mg/dL (ref 0.44–1.00)
GFR calc Af Amer: >60 mL/min (ref >60)
GFR calc non Af Amer: >60 mL/min (ref >60)
Glucose, Bld: 104 mg/dL — ABNORMAL HIGH (ref 65–99)
Potassium: 4.2 mmol/L (ref 3.5–5.1)
Sodium: 137 mmol/L (ref 135–145)
Total Bilirubin: 0.5 mg/dL (ref 0.3–1.2)
Total Protein: 7.3 g/dL (ref 6.5–8.1)

## 2014-07-14 LAB — CBC WITH DIFFERENTIAL/PLATELET
Basophils Absolute: 0 10*3/uL (ref 0.0–0.1)
Basophils Relative: 0 % (ref 0–1)
Eosinophils Absolute: 0.2 10*3/uL (ref 0.0–0.7)
Eosinophils Relative: 2 % (ref 0–5)
HCT: 38 % (ref 36.0–46.0)
Hemoglobin: 12.5 g/dL (ref 12.0–15.0)
Lymphocytes Relative: 22 % (ref 12–46)
Lymphs Abs: 2.1 10*3/uL (ref 0.7–4.0)
MCH: 29.8 pg (ref 26.0–34.0)
MCHC: 32.9 g/dL (ref 30.0–36.0)
MCV: 90.5 fL (ref 78.0–100.0)
Monocytes Absolute: 0.6 10*3/uL (ref 0.1–1.0)
Monocytes Relative: 7 % (ref 3–12)
Neutro Abs: 6.4 10*3/uL (ref 1.7–7.7)
Neutrophils Relative %: 69 % (ref 43–77)
Platelets: 547 10*3/uL — ABNORMAL HIGH (ref 150–400)
RBC: 4.2 MIL/uL (ref 3.87–5.11)
RDW: 13.5 % (ref 11.5–15.5)
WBC: 9.3 10*3/uL (ref 4.0–10.5)

## 2014-07-14 MED ORDER — SODIUM CHLORIDE 0.9 % IV SOLN: 250.0000 mL | INTRAVENOUS | Status: DC | PRN

## 2014-07-14 MED ORDER — ONDANSETRON HCL 4 MG PO TABS
4.0000 mg | ORAL_TABLET | Freq: Four times a day (QID) | ORAL | Status: DC | PRN
Start: 2014-07-14 — End: 2014-07-16

## 2014-07-14 MED ORDER — MORPHINE SULFATE 2 MG/ML IJ SOLN: 2.0000 mg | INTRAMUSCULAR | Status: DC | PRN

## 2014-07-14 MED ORDER — ONDANSETRON HCL 4 MG/2ML IJ SOLN: 4.0000 mg | Freq: Four times a day (QID) | INTRAMUSCULAR | Status: DC | PRN

## 2014-07-14 MED ORDER — ASPIRIN EC 81 MG PO TBEC
81.0000 mg | DELAYED_RELEASE_TABLET | Freq: Every day | ORAL | Status: DC
  Administered 2014-07-15 – 2014-07-16 (×2): 81 mg via ORAL
  Filled 2014-07-14 (×2): qty 1

## 2014-07-14 MED ORDER — OXYCODONE HCL 5 MG PO TABS
5.0000 mg | ORAL_TABLET | ORAL | Status: DC | PRN
  Administered 2014-07-15: 5 mg via ORAL
  Filled 2014-07-14: qty 1

## 2014-07-14 MED ORDER — SODIUM CHLORIDE 0.9 % IJ SOLN
3.0000 mL | Freq: Two times a day (BID) | INTRAMUSCULAR | Status: DC
  Administered 2014-07-15 (×3): 3 mL via INTRAVENOUS

## 2014-07-14 MED ORDER — CEFAZOLIN SODIUM-DEXTROSE 2-3 GM-% IV SOLR
2.0000 g | Freq: Three times a day (TID) | INTRAVENOUS | Status: DC
  Administered 2014-07-15 (×2): 2 g via INTRAVENOUS
  Filled 2014-07-14 (×4): qty 50

## 2014-07-14 MED ORDER — METHOCARBAMOL 500 MG PO TABS
500.0000 mg | ORAL_TABLET | Freq: Three times a day (TID) | ORAL | Status: DC | PRN
  Administered 2014-07-15 (×2): 500 mg via ORAL
  Filled 2014-07-14 (×2): qty 1

## 2014-07-14 MED ORDER — POLYETHYLENE GLYCOL 3350 17 G PO PACK: 17.0000 g | PACK | Freq: Every day | ORAL | Status: DC | PRN

## 2014-07-14 MED ORDER — SODIUM CHLORIDE 0.9 % IJ SOLN: 3.0000 mL | INTRAMUSCULAR | Status: DC | PRN

## 2014-07-14 NOTE — ED Notes (Signed)
Brooks MD at bedside

## 2014-07-14 NOTE — ED Provider Notes (Signed)
CSN: [id number]     Arrival date & time 07/14/14  1747 History   First MD Initiated Contact with Patient 07/14/14 2143     Chief Complaint  Patient presents with  . Back Pain     (Consider location/radiation/quality/duration/timing/severity/associated sxs/prior Treatment) Patient is a 59 y.o. female presenting with back pain. The history is provided by the patient (the the pt has a hx of upper back pain.  she had an mri at the ortho office and she was called today and told to come to the hospital).  Back Pain Pain location: upper back. Radiates to:  Does not radiate Pain severity:  Mild Pain is:  Same all the time Onset quality:  Gradual Timing:  Constant Progression:  Unchanged Chronicity:  New Context: not emotional stress   Associated symptoms: no abdominal pain, no chest pain and no headaches     Past Medical History  Diagnosis Date  . Hyperlipidemia   . Hypertension   . Obesity   . Hyperglycemia   . Detached retina 06/23/2008  . Colon polyps    Past Surgical History  Procedure Laterality Date  . Breast biopsy Left     benign  . Eye surgery Left     cataract  . Tonsillectomy     Family History  Problem Relation Age of Onset  . Diabetes Mother   . Heart disease Mother     MI, CAD  . Cancer Mother     breast  . Hypertension Mother   . Cancer Maternal Uncle     throat  . Cancer Maternal Uncle     bone  . Cancer Father     lung  . Hypertension Father   . Heart disease Father     pacemaker, MI in 7850's   History  Substance Use Topics  . Smoking status: Never Smoker   . Smokeless tobacco: Never Used  . Alcohol Use: No   OB History    No data available     Review of Systems  Constitutional: Negative for appetite change and fatigue.  HENT: Negative for congestion, ear discharge and sinus pressure.   Eyes: Negative for discharge.  Respiratory: Negative for cough.   Cardiovascular: Negative for chest pain.  Gastrointestinal: Negative for abdominal  pain and diarrhea.  Genitourinary: Negative for frequency and hematuria.  Musculoskeletal: Positive for back pain.  Skin: Negative for rash.  Neurological: Negative for seizures and headaches.  Psychiatric/Behavioral: Negative for hallucinations.      Allergies  Iodine  Home Medications   Prior to Admission medications   Medication Sig Start Date End Date Taking? Authorizing Provider  Ascorbic Acid (VITAMIN C) 1000 MG tablet Take 1,000 mg by mouth daily.    Historical Provider, MD  aspirin 81 MG tablet Take 81 mg by mouth daily.    Historical Provider, MD  cephALEXin (KEFLEX) 500 MG capsule Take 1 capsule (500 mg total) by mouth 4 (four) times daily. 07/10/14   Dorena BodoMary B Dixon, PA-C  HYDROcodone-acetaminophen (NORCO) 10-325 MG per tablet Take 1 tablet by mouth 3 (three) times daily as needed. 07/04/14   Historical Provider, MD  Multiple Vitamin (MULTIVITAMIN) capsule Take 1 capsule by mouth daily. Takes a Centrum MVI daily    Historical Provider, MD  Omega-3 Fatty Acids (FISH OIL) 1200 MG CAPS Take 2 capsules by mouth every morning.    Historical Provider, MD  simvastatin (ZOCOR) 10 MG tablet TAKE 1 TABLET AT BEDTIME    Dorena BodoMary B Dixon, PA-C  tiZANidine (  ZANAFLEX) 4 MG tablet TAKE 1 TABLET BY MOUTH EVERY 8 HOURS AS NEEDED FOR SPASM 06/30/14   Historical Provider, MD  valsartan-hydrochlorothiazide (DIOVAN-HCT) 320-25 MG per tablet TAKE 1 TABLET BY MOUTH EVERY DAY 05/29/14   Dorena Bodo, PA-C  vitamin E 400 UNIT capsule Take 400 Units by mouth daily.    Historical Provider, MD   BP 134/69 mmHg  Pulse 78  Temp(Src) 98.1 F (36.7 C) (Oral)  Resp 24  Ht  (1.6 m)  Wt 279 lb (126.554 kg)  BMI 49.44 kg/m2  SpO2 98% Physical Exam  Constitutional: She is oriented to person, place, and time. She appears well-developed.  HENT:  Head: Normocephalic.  Eyes: Conjunctivae and EOM are normal. No scleral icterus.  Neck: Neck supple. No thyromegaly present.  Cardiovascular: Normal rate and  regular rhythm.  Exam reveals no gallop and no friction rub.   No murmur heard. Pulmonary/Chest: No stridor. She has no wheezes. She has no rales. She exhibits no tenderness.  Abdominal: She exhibits no distension. There is no tenderness. There is no rebound.  Musculoskeletal: Normal range of motion. She exhibits no edema.  Tender upper trapezius muscle  Lymphadenopathy:    She has no cervical adenopathy.  Neurological: She is oriented to person, place, and time. She exhibits normal muscle tone. Coordination normal.  Skin: No rash noted. No erythema.  Psychiatric: She has a normal mood and affect. Her behavior is normal.    ED Course  Procedures (including critical care time) Labs Review Labs Reviewed  CBC WITH DIFFERENTIAL/PLATELET - Abnormal; Notable for the following:    Platelets 547 (*)    All other components within normal limits  COMPREHENSIVE METABOLIC PANEL - Abnormal; Notable for the following:    Glucose, Bld 104 (*)    Albumin 3.0 (*)    All other components within normal limits    Imaging Review No results found.   EKG Interpretation None      MDM   Final diagnoses:  None      Pt seen by ortho for back pain.  She was admitted by ortho because mri shows infection  Bethann Berkshire, MD 07/17/14 [phone]

## 2014-07-14 NOTE — ED Notes (Signed)
Pt st's she has been having upper back pain, had MRI yesterday and was called today and told she had a spinal infection.  Pt is currently taking antibiotic for foot infection

## 2014-07-14 NOTE — ED Notes (Signed)
MD at bedside. 

## 2014-07-14 NOTE — ED Notes (Signed)
Faxed [facility] for MRI results at this time.

## 2014-07-14 NOTE — ED Notes (Signed)
Consulting PA at bedside. 

## 2014-07-14 NOTE — ED Notes (Signed)
Per pt, [facility], MRI ordered by Dr. Mikle Bosworthaymos.

## 2014-07-14 NOTE — H&P (Signed)
Full HPI dictated Plan: admit for IV abx ID consult in AM CAM boot to right leg for cellulitis Aspen collar for c-spine Will order CT scan c-spine to evaluate for lytic changes in vertebral body

## 2014-07-15 ENCOUNTER — Inpatient Hospital Stay (HOSPITAL_COMMUNITY): Payer: BLUE CROSS/BLUE SHIELD

## 2014-07-15 ENCOUNTER — Encounter (HOSPITAL_COMMUNITY): Payer: Self-pay

## 2014-07-15 DIAGNOSIS — B9689 Other specified bacterial agents as the cause of diseases classified elsewhere: Secondary | ICD-10-CM

## 2014-07-15 DIAGNOSIS — M4642 Discitis, unspecified, cervical region: Principal | ICD-10-CM

## 2014-07-15 DIAGNOSIS — L03115 Cellulitis of right lower limb: Secondary | ICD-10-CM

## 2014-07-15 LAB — URINALYSIS, ROUTINE W REFLEX MICROSCOPIC
Bilirubin Urine: NEGATIVE
Glucose, UA: NEGATIVE mg/dL
Hgb urine dipstick: NEGATIVE
Ketones, ur: NEGATIVE mg/dL
Leukocytes, UA: NEGATIVE
Nitrite: NEGATIVE
Protein, ur: NEGATIVE mg/dL
Specific Gravity, Urine: 1.019 (ref 1.005–1.030)
Urobilinogen, UA: 0.2 mg/dL (ref 0.0–1.0)
pH: 6 (ref 5.0–8.0)

## 2014-07-15 LAB — C-REACTIVE PROTEIN: CRP: 6.7 mg/dL — ABNORMAL HIGH (ref <1.0)

## 2014-07-15 MED ORDER — VALSARTAN-HYDROCHLOROTHIAZIDE 320-25 MG PO TABS: 1.0000 | ORAL_TABLET | Freq: Every day | ORAL | Status: DC

## 2014-07-15 MED ORDER — CIPROFLOXACIN HCL 500 MG PO TABS
750.0000 mg | ORAL_TABLET | Freq: Two times a day (BID) | ORAL | Status: DC
  Administered 2014-07-15 – 2014-07-16 (×3): 750 mg via ORAL
  Filled 2014-07-15 (×5): qty 1

## 2014-07-15 MED ORDER — HYDROCHLOROTHIAZIDE 25 MG PO TABS
25.0000 mg | ORAL_TABLET | Freq: Every day | ORAL | Status: DC
  Administered 2014-07-16: 25 mg via ORAL
  Filled 2014-07-15: qty 1

## 2014-07-15 MED ORDER — SODIUM CHLORIDE 0.9 % IJ SOLN
10.0000 mL | INTRAMUSCULAR | Status: DC | PRN
  Administered 2014-07-16 (×2): 10 mL
  Filled 2014-07-15 (×2): qty 40

## 2014-07-15 MED ORDER — VANCOMYCIN HCL 10 G IV SOLR
2000.0000 mg | Freq: Once | INTRAVENOUS | Status: AC
  Administered 2014-07-15: 2000 mg via INTRAVENOUS
  Filled 2014-07-15 (×2): qty 2000

## 2014-07-15 MED ORDER — IRBESARTAN 300 MG PO TABS
300.0000 mg | ORAL_TABLET | Freq: Every day | ORAL | Status: DC
  Administered 2014-07-16: 300 mg via ORAL
  Filled 2014-07-15: qty 1

## 2014-07-15 MED ORDER — VANCOMYCIN HCL IN DEXTROSE 1-5 GM/200ML-% IV SOLN
1000.0000 mg | Freq: Two times a day (BID) | INTRAVENOUS | Status: DC
  Administered 2014-07-16 (×2): 1000 mg via INTRAVENOUS
  Filled 2014-07-15 (×3): qty 200

## 2014-07-15 NOTE — Progress Notes (Signed)
Pt arrived to unit via stretcher from the ED.  Pt alert and oriented to room, staff and plan of care.  Family at bedside.  Call bell within reach, bed low, and alarm active.
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
## 2014-07-15 NOTE — Progress Notes (Signed)
Nutrition Brief Note  Patient identified on the Malnutrition Screening Tool (MST) Report  Wt Readings from Last 15 Encounters:  07/15/14 270 lb 1.6 oz (122.517 kg)  07/09/14 274 lb (124.286 kg)  05/01/13 269 lb (122.018 kg)  09/02/12 263 lb (119.296 kg)    Body mass index is 47.86 kg/(m^2). Patient meets criteria for Morbid Obesity based on current BMI. Pt states that her appetite is good and she is eating well. She denies any questions or concerns.   Current diet order is Regular, patient is consuming approximately 100% of meals at this time. Labs and medications reviewed.   No nutrition interventions warranted at this time. If nutrition issues arise, please consult RD.   Ian Malkineanne Barnett RD, LDN Inpatient Clinical Dietitian Pager: [phone] After Hours Pager: [phone]

## 2014-07-15 NOTE — Progress Notes (Signed)
    Subjective:      Patient reports pain as 3 on 0-10 scale.  Reports deferred at this time arm pain   Positive void Negative bowel movement Positive flatus Negative chest pain or shortness of breath  Objective: Vital signs in last 24 hours: Temp:  [97.8 F (36.6 C)-98.2 F (36.8 C)] 98.1 F (36.7 C) (06/29 1851) Pulse Rate:  [73-89] 87 (06/29 1851) Resp:  [16-24] 18 (06/29 1851) BP: (133-163)/(58-80) 148/62 mmHg (06/29 1851) SpO2:  [96 %-99 %] 98 % (06/29 1851) Weight:  [122.517 kg (270 lb 1.6 oz)] 122.517 kg (270 lb 1.6 oz) (06/29 0136)  Intake/Output from previous day: 06/28 0701 - 06/29 0700 In: 3 [I.V.:3] Out: -   Labs:  Recent Labs  07/14/14 1812  WBC 9.3  RBC 4.20  HCT 38.0  PLT 547*    Recent Labs  07/14/14 1812  NA 137  K 4.2  CL 103  CO2 27  BUN 15  CREATININE 0.71  GLUCOSE 104*  CALCIUM 9.2   No results for input(s): LABPT, INR in the last 72 hours.  Physical Exam: Neurologically intact Intact pulses distally Compartment soft  Assessment/Plan: Patient stable  CT scan: no lytic destruction of vertebral body.   No focal neuro deficits No significant cervical pain Mobilization with physical therapy Encourage incentive spirometry Continue care  Agree with IV abx  No indication for surgery at this point  Venita Lickahari Charmika Macdonnell, MD [facility] [phone]

## 2014-07-15 NOTE — H&P (Signed)
[place] [employer]:  Swanson, Lori              ACCOUNT NO.:  [id number]  MEDICAL RECORD NO.:  [id number]  LOCATION:  [id number]                        FACILITY:  [facility]  PHYSICIAN:  Chia Rock D. Shon BatonBrooks, M.D. DATE OF BIRTH:  November 30, 1955  DATE OF ADMISSION:  07/14/2014 DATE OF DISCHARGE:                             HISTORY & PHYSICAL   ADMITTING DIAGNOSIS:  Cervical diskitis, C5-C6.  HISTORY:  This is a very pleasant 59 year old woman who has severe neck pain approximately for 2 weeks.  She states that it has gotten better. About 3 days after the onset of her severe neck pain, she noted swelling and redness in the right foot.  She was seen by her [facility] and started on Keflex for cellulitis.  The patient has been seen by a [facility].  This afternoon I was asked to evaluate MRI.  The MRI report indicated this patient has diskitis, no abscess.  As a result, I recommended that the patient be sent to the emergency room for further evaluation and possible IV antibiotic therapy.  I was notified at approximately 10:10 that the patient was here in the emergency room.  The patient currently does have neck pain and is not as bad has been over the last 2 weeks.  Past medical, surgical, family, and social history is only remarkable for recent diagnosis of right foot cellulitis, which she is improving. She has a history of high blood pressure and no cardiopulmonary medical problems.  She is morbidly obese.  She does ambulate.  She is a Tourist information centre managercommunity ambulator.  CLINICAL EXAMINATION:  She is a pleasant woman, appears stated age, in no acute distress.  She has neck pain with range of motion and palpation, especially with extension of the cervical spine.  5/5 strength in the upper and lower extremities.  Negative Babinski test. Negative Hoffman sign.  1+ deep tendon reflexes.  Negative inverted brachioradialis reflex.  Compartments are soft and nontender.   There is erythema over the lateral aspect of the ankle and minimal amount over the medial.  There is some swelling of the foot and ankle region. Compartments in her foot are also soft and nontender.  Sensation is intact throughout in the upper and lower extremities.  No focal motor deficits in the upper or lower extremities.  Abdomen is soft, nontender. No dysuria.  No pelvic pain with palpation.  No shortness of breath or chest pain.  PLAN:  At this point in time, I will admit her to the hospital to start IV Ancef for the presumed diskitis.  I will have Infectious Disease evaluate the patient in the morning to assist and modify antibiotic therapy.  If it was felt that prolonged IV therapy was necessary, then we will move forward with a PICC line and also put in a cervical collar. We will also place a Cam boot on the right lower extremity to assist in ambulation and decrease her pain.    [name] D. Shon BatonBrooks, M.D.    DDB/MEDQ  D:  07/14/2014  T:  07/15/2014  Job:  [id number]

## 2014-07-15 NOTE — Progress Notes (Signed)
ANTIBIOTIC CONSULT NOTE - INITIAL  Pharmacy Consult for vancomycin Indication: diskitis osteomyelitis  Allergies  Allergen Reactions  . Iodine Swelling    Patient Measurements: Height: 5\' 3"  (160 cm) Weight: 270 lb 1.6 oz (122.517 kg) IBW/kg (Calculated) : 52.4 Adjusted Body Weight:   Vital Signs: Temp: 97.8 F (36.6 C) (06/29 1038) Temp Source: Oral (06/29 1038) BP: 142/77 mmHg (06/29 1038) Pulse Rate: 84 (06/29 1038) Intake/Output from previous day: 06/28 0701 - 06/29 0700 In: 3 [I.V.:3] Out: -  Intake/Output from this shift:    Labs:  Recent Labs  07/14/14 1812  WBC 9.3  HGB 12.5  PLT 547*  CREATININE 0.71   Estimated Creatinine Clearance: 96.1 mL/min (by C-G formula based on Cr of 0.71). No results for input(s): VANCOTROUGH, VANCOPEAK, VANCORANDOM, GENTTROUGH, GENTPEAK, GENTRANDOM, TOBRATROUGH, TOBRAPEAK, TOBRARND, AMIKACINPEAK, AMIKACINTROU, AMIKACIN in the last 72 hours.   Microbiology: No results found for this or any previous visit (from the past 720 hour(s)).  Medical History: Past Medical History  Diagnosis Date  . Hyperlipidemia   . Hypertension   . Obesity   . Hyperglycemia   . Detached retina 06/23/2008  . Colon polyps     Medications:  Anti-infectives    Start     Dose/Rate Route Frequency Ordered Stop   07/16/14 0200  vancomycin (VANCOCIN) IVPB 1000 mg/200 mL premix     1,000 mg 200 mL/hr over 60 Minutes Intravenous Every 12 hours 07/15/14 1308     07/15/14 1330  vancomycin (VANCOCIN) 2,000 mg in sodium chloride 0.9 % 500 mL IVPB     2,000 mg 250 mL/hr over 120 Minutes Intravenous  Once 07/15/14 1301     07/15/14 1215  ciprofloxacin (CIPRO) tablet 750 mg     750 mg Oral 2 times daily 07/15/14 1200     07/15/14 0000  ceFAZolin (ANCEF) IVPB 2 g/50 mL premix  Status:  Discontinued     2 g 100 mL/hr over 30 Minutes Intravenous 3 times per day 07/14/14 2350 07/15/14 1200     Assessment: 59 yom admitted to the hospital for initiation of  IV antibiotic for cervical diskitis osteomyelitis. Pt is afebrile and WBC is WNL. SCr is WNL. Started on keflex PTA for cellulitis. ID is planning to treat through 8/9.   Vanc 6/29>> Cipro 6/29>>  Goal of Therapy:  Vancomycin trough level 15-20 mcg/ml  Plan:  - Vancomycin 2gm IV x 1 then 1gm IV Q12H - F/u renal fxn, C&S, clinical status and trough at [facility]  Koraima Albertsen, Drake Leachachel Lynn 07/15/2014,1:09 PM

## 2014-07-15 NOTE — Progress Notes (Signed)
Peripherally Inserted Central Catheter/Midline Placement  The IV Nurse has discussed with the patient and/or persons authorized to consent for the patient, the purpose of this procedure and the potential benefits and risks involved with this procedure.  The benefits include less needle sticks, lab draws from the catheter and patient may be discharged home with the catheter.  Risks include, but not limited to, infection, bleeding, blood clot (thrombus formation), and puncture of an artery; nerve damage and irregular heat beat.  Alternatives to this procedure were also discussed.  PICC/Midline Placement Documentation        Timmothy Soursewman, Aleena Kirkeby Renee 07/15/2014, 5:39 PM

## 2014-07-16 LAB — HIV ANTIBODY (ROUTINE TESTING W REFLEX): HIV Screen 4th Generation wRfx: NONREACTIVE

## 2014-07-16 LAB — SEDIMENTATION RATE: Sed Rate: 117 mm/hr — ABNORMAL HIGH (ref 0–22)

## 2014-07-16 MED ORDER — ACETAMINOPHEN 325 MG PO TABS
650.0000 mg | ORAL_TABLET | Freq: Four times a day (QID) | ORAL | Status: DC | PRN
  Administered 2014-07-16: 650 mg via ORAL
  Filled 2014-07-16: qty 2

## 2014-07-16 MED ORDER — POLYETHYLENE GLYCOL 3350 17 G PO PACK: 17.0000 g | PACK | Freq: Every day | ORAL | Status: DC | PRN

## 2014-07-16 MED ORDER — CIPROFLOXACIN HCL 750 MG PO TABS: 750.0000 mg | ORAL_TABLET | Freq: Two times a day (BID) | ORAL | Status: DC

## 2014-07-16 MED ORDER — OXYCODONE-ACETAMINOPHEN 10-325 MG PO TABS: 1.0000 | ORAL_TABLET | ORAL | Status: DC | PRN

## 2014-07-16 MED ORDER — OXYCODONE HCL 5 MG PO TABS: 5.0000 mg | ORAL_TABLET | ORAL | Status: DC | PRN

## 2014-07-16 MED ORDER — POLYETHYLENE GLYCOL 3350 17 GM/SCOOP PO POWD: 17.0000 g | Freq: Every day | ORAL | Status: DC

## 2014-07-16 MED ORDER — VANCOMYCIN HCL IN DEXTROSE 1-5 GM/200ML-% IV SOLN: 1000.0000 mg | Freq: Two times a day (BID) | INTRAVENOUS | Status: DC

## 2014-07-16 MED ORDER — HEPARIN SOD (PORK) LOCK FLUSH 100 UNIT/ML IV SOLN
250.0000 [IU] | INTRAVENOUS | Status: AC | PRN
Start: 2014-07-16 — End: 2014-07-16
  Administered 2014-07-16: 250 [IU]

## 2014-07-16 MED ORDER — ONDANSETRON HCL 4 MG PO TABS: 4.0000 mg | ORAL_TABLET | Freq: Three times a day (TID) | ORAL | Status: DC | PRN

## 2014-07-16 NOTE — Progress Notes (Signed)
    Subjective:      Patient reports pain as 2 on 0-10 scale.  Reports none arm pain reports mild to moderate neck pain   Positive void Negative bowel movement Positive flatus Negative chest pain or shortness of breath  Objective: Vital signs in last 24 hours: Temp:  [97.7 F (36.5 C)-98.6 F (37 C)] 98 F (36.7 C) (06/30 0900) Pulse Rate:  [73-92] 82 (06/30 0900) Resp:  [16-20] 18 (06/30 0900) BP: (119-163)/(59-80) 123/59 mmHg (06/30 0900) SpO2:  [94 %-98 %] 96 % (06/30 0900)  Intake/Output from previous day:    Labs:  Recent Labs  07/14/14 1812  WBC 9.3  RBC 4.20  HCT 38.0  PLT 547*    Recent Labs  07/14/14 1812  NA 137  K 4.2  CL 103  CO2 27  BUN 15  CREATININE 0.71  GLUCOSE 104*  CALCIUM 9.2   No results for input(s): LABPT, INR in the last 72 hours.  Physical Exam: Neurologically intact ABD soft Intact pulses distally Compartment soft Decreased swelling left LE. Decreased erthyema  Assessment/Plan: Patient stable  Doing well overall No focal neuro deficits Pain controlled with oral medication Plan on d/c to [facility] Will need nsg for IV medication.   Mobilization with physical therapy Encourage incentive spirometry Continue care  F/u in 2 weeks for re-evaluation  Lori Lickahari Catelynn Sparger, MD [facility] [phone]

## 2014-07-16 NOTE — Care Management Note (Signed)
Case Management Note  Patient Details  Name: BRAILEE RIEDE MRN: [id number] Date of Birth: [birth year]  Subjective/Objective:                    Action/Plan: Received a call from Dr Rolena Infante' regarding discharge.  Per Dr Rolena Infante, script will be sent to the nursing unit.  Home health orders to be placed by CM, per his request.  CM met with patient, who has chosen [facility] for [facility] services.  Spoke with Carolynn Sayers with [facility], who has requested the referral and will begin teaching prior to discharge home today.  Bedside RN updated. Expected Discharge Date:                  Expected Discharge Plan:  [place]  In-House Referral:     Discharge planning Services     Post Acute Care Choice:  Home Health Choice offered to:     DME Arranged:    DME Agency:     HH Arranged:  RN, IV Antibiotics HH Agency:  [place]  Status of Service:  Completed, signed off  Medicare Important Message Given:    Date Medicare IM Given:    Medicare IM give by:    Date Additional Medicare IM Given:    Additional Medicare Important Message give by:     If discussed at [place] of Stay Meetings, dates discussed:    Additional Comments:  Rolm Baptise, RN 07/16/2014, 11:47 AM

## 2014-07-16 NOTE — Progress Notes (Signed)
Received a call from bedside RN stating that attending MD wanted to know if patient's HH IV antibiotics had been arranged, as patient is discharged.  CM had not been made aware of plans for discharge home on IV antibiotic therapy.  Message was left with Dr Shon BatonBrooks' office requesting orders and prescription.  Awaiting return call.

## 2014-07-16 NOTE — Progress Notes (Signed)
D/C orders received, pt for D/C home today with Home Health Care.  IV and telemetry D/C.  Rx and D/C instructions given with verbalized understanding.  Family at bedside to assist with D/C.  Staff brought pt downstairs via wheelchair.

## 2014-07-16 NOTE — Discharge Summary (Signed)
Patient ID: Lori Swanson MRN: [id number] DOB/AGE: 06/15/1955 59 y.o.  Admit date: 07/14/2014 Discharge date: 07/16/2014  Admission Diagnoses:  Active Problems:   Cervical discitis   Discharge Diagnoses:  Active Problems:   Cervical discitis  status post   Past Medical History  Diagnosis Date  . Hyperlipidemia   . Hypertension   . Obesity   . Hyperglycemia   . Detached retina 06/23/2008  . Colon polyps     Surgeries:  on * No surgery found *   Consultants:  Dr Luciana Axe (ID)   Treatment Team:  Lori Lick, MD  Discharged Condition: Improved  Hospital Course: Lori Swanson is an 59 y.o. female who was admitted 07/14/2014 for operative treatment of cervical discitis, left foot cellulitis. Patient failed conservative treatments (please see the history and physical for the specifics) and had severe unremitting pain that affects sleep, daily activities and work/hobbies. After pre-op clearance, the patient was taken to the operating room on * No surgery found * and underwent  .    Patient was given perioperative antibiotics: Anti-infectives    Start     Dose/Rate Route Frequency Ordered Stop   07/16/14 0200  vancomycin (VANCOCIN) IVPB 1000 mg/200 mL premix     1,000 mg 200 mL/hr over 60 Minutes Intravenous Every 12 hours 07/15/14 1308     07/16/14 0000  ciprofloxacin (CIPRO) 750 MG tablet    Comments:  6 week course   750 mg Oral 2 times daily 07/16/14 1032     07/16/14 0000  vancomycin (VANCOCIN) 1 GM/200ML SOLN    Comments:  6 week IV ABX course   1,000 mg 200 mL/hr over 60 Minutes Intravenous Every 12 hours 07/16/14 1032     07/15/14 1330  vancomycin (VANCOCIN) 2,000 mg in sodium chloride 0.9 % 500 mL IVPB     2,000 mg 250 mL/hr over 120 Minutes Intravenous  Once 07/15/14 1301 07/15/14 1553   07/15/14 1215  ciprofloxacin (CIPRO) tablet 750 mg     750 mg Oral 2 times daily 07/15/14 1200     07/15/14 0000  ceFAZolin (ANCEF) IVPB 2 g/50 mL premix  Status:   Discontinued     2 g 100 mL/hr over 30 Minutes Intravenous 3 times per day 07/14/14 2350 07/15/14 1200       Patient was given sequential compression devices and early ambulation to prevent DVT.   Patient benefited maximally from hospital stay and there were no complications. At the time of discharge, the patient was urinating/moving their bowels without difficulty, tolerating a regular diet, pain is controlled with oral pain medications and they have been cleared by PT/OT.   Recent vital signs: Patient Vitals for the past 24 hrs:  BP Temp Temp src Pulse Resp SpO2  07/16/14 0900 (!) 123/59 mmHg 98 F (36.7 C) Oral 82 18 96 %  07/16/14 0847 (!) 142/67 mmHg 97.7 F (36.5 C) Oral 92 20 96 %  07/16/14 0539 140/65 mmHg 98.2 F (36.8 C) Oral 73 20 94 %  07/16/14 0135 121/64 mmHg 98.6 F (37 C) Oral 79 16 95 %  07/15/14 2156 119/62 mmHg 98.6 F (37 C) Oral 80 19 98 %  07/15/14 1851 (!) 148/62 mmHg 98.1 F (36.7 C) Oral 87 18 98 %  07/15/14 1453 (!) 163/80 mmHg 98 F (36.7 C) Oral 89 18 96 %     Recent laboratory studies:  Recent Labs  07/14/14 1812  WBC 9.3  HGB 12.5  HCT  38.0  PLT 547*  NA 137  K 4.2  CL 103  CO2 27  BUN 15  CREATININE 0.71  GLUCOSE 104*  CALCIUM 9.2     Discharge Medications:     Medication List    STOP taking these medications        cephALEXin 500 MG capsule  Commonly known as:  KEFLEX     HYDROcodone-acetaminophen 10-325 MG per tablet  Commonly known as:  NORCO     ibuprofen 200 MG tablet  Commonly known as:  ADVIL,MOTRIN     vitamin C 1000 MG tablet      TAKE these medications        aspirin 81 MG tablet  Take 81 mg by mouth daily.     ciprofloxacin 750 MG tablet  Commonly known as:  CIPRO  Take 1 tablet (750 mg total) by mouth 2 (two) times daily.     Fish Oil 1200 MG Caps  Take 2 capsules by mouth every morning.     multivitamin capsule  Take 1 capsule by mouth daily. Takes a Centrum MVI daily     ondansetron 4 MG  tablet  Commonly known as:  ZOFRAN  Take 1 tablet (4 mg total) by mouth every 8 (eight) hours as needed for nausea or vomiting.     oxyCODONE-acetaminophen 10-325 MG per tablet  Commonly known as:  PERCOCET  Take 1 tablet by mouth every 4 (four) hours as needed for pain.     polyethylene glycol packet  Commonly known as:  MIRALAX / GLYCOLAX  Take 17 g by mouth daily as needed for mild constipation.     polyethylene glycol powder powder  Commonly known as:  GLYCOLAX  Take 17 g by mouth daily.     tiZANidine 4 MG tablet  Commonly known as:  ZANAFLEX  TAKE 1 TABLET BY MOUTH EVERY 8 HOURS AS NEEDED FOR SPASM     valsartan-hydrochlorothiazide 320-25 MG per tablet  Commonly known as:  DIOVAN-HCT  TAKE 1 TABLET BY MOUTH EVERY DAY     vancomycin 1 GM/200ML Soln  Commonly known as:  VANCOCIN  Inject 200 mLs (1,000 mg total) into the vein every 12 (twelve) hours.     vitamin E 400 UNIT capsule  Take 400 Units by mouth daily.        Diagnostic Studies: Ct Cervical Spine Wo Contrast  07/15/2014   CLINICAL DATA:  Cervical discitis and posterior neck pain, no trauma. Assess for infection. History of hyperlipidemia, hypertension, hyperglycemia.  EXAM: CT CERVICAL SPINE WITHOUT CONTRAST  TECHNIQUE: Multidetector CT imaging of the cervical spine was performed without intravenous contrast. Multiplanar CT image reconstructions were also generated.  COMPARISON:  None.  FINDINGS: Cervical vertebral bodies and posterior elements are intact. Straightened cervical lordosis. Broad lower cervical levoscoliosis. Severe C5-6 and C6-7 disc height loss, uncovertebral hypertrophy and marginal spurring, moderate to severe at C3-4 and C4-5. Mild canal stenosis at C5-6 and C6-7. Severe RIGHT C3-4, C4-5 and moderate to severe RIGHT C5-6 neural foraminal narrowing. Moderate to severe LEFT C3-4, moderate LEFT C6-7 neural foraminal narrowing. No destructive bony lesions. Included prevertebral and paraspinal soft  tissues are nonsuspicious, mild calcific atherosclerosis the carotid bulbs. Moderate degenerative changes included RIGHT temporomandibular joint.  IMPRESSION: Straightened cervical lordosis without acute fracture nor malalignment.  Mild osseous canal stenosis C5-6 and C6-7. Multilevel neural foraminal narrowing, severe on the RIGHT from C3-4 to C4-5.  No destructive bony lesions, however MRI of the cervical spine with contrast  would be more sensitive for assessment of discitis osteomyelitis.   Electronically Signed   By: Awilda Metroourtnay  Bloomer M.Swanson.   On: 07/15/2014 01:30          Follow-up Information    Follow up with Lori RighterOMER, ROBERT, MD In 2 weeks.   Specialty:  Infectious Diseases   Why:  If symptoms worsen.  Antibiotic follow up   Contact information:   [address] [place] [address] [phone]       Follow up with Lori Swanson,Lori Rogoff D, MD In 2 weeks.   Specialty:  Orthopedic Surgery   Why:  If symptoms worsen.     Contact information:   [address] [place] [address] [phone]       Discharge Plan:  discharge to home Disposition: hospital course: patient remained neurologically intact.  No fevers/ chills.  Left LE swelling and ecchymosis improved.   Per ID consult antibiotic regimen adjusted.  Agree with 6 week IV therapy.  Patient will f/u with me in 2 weeks    Signed: Venita LickBROOKS,Syon Tews Swanson for Dr. Venita Lickahari Abbygail [facility] [phone] 07/16/2014, 10:38 AM

## 2014-07-17 ENCOUNTER — Other Ambulatory Visit: Payer: Self-pay | Admitting: Physician Assistant

## 2014-07-17 LAB — HEPATITIS C ANTIBODY: HCV Ab: <0.1 s/co ratio (ref 0.0–0.9)

## 2014-07-17 NOTE — Telephone Encounter (Signed)
Medication refilled per protocol. 

## 2014-07-19 ENCOUNTER — Other Ambulatory Visit (HOSPITAL_COMMUNITY)
Admission: RE | Admit: 2014-07-19 | Discharge: 2014-07-19 | Disposition: A | Discharge status: Home / Self Care | Payer: BLUE CROSS/BLUE SHIELD | Source: Other Acute Inpatient Hospital | Attending: Orthopedic Surgery | Admitting: Orthopedic Surgery

## 2014-07-19 DIAGNOSIS — Z5181 Encounter for therapeutic drug level monitoring: Secondary | ICD-10-CM | POA: Insufficient documentation

## 2014-07-19 LAB — BASIC METABOLIC PANEL
Anion gap: 10 (ref 5–15)
BUN: 14 mg/dL (ref 6–20)
CO2: 29 mmol/L (ref 22–32)
Calcium: 8.8 mg/dL — ABNORMAL LOW (ref 8.9–10.3)
Chloride: 100 mmol/L — ABNORMAL LOW (ref 101–111)
Creatinine, Ser: 0.57 mg/dL (ref 0.44–1.00)
GFR calc Af Amer: >60 mL/min (ref >60)
GFR calc non Af Amer: >60 mL/min (ref >60)
Glucose, Bld: 100 mg/dL — ABNORMAL HIGH (ref 65–99)
Potassium: 4.5 mmol/L (ref 3.5–5.1)
Sodium: 139 mmol/L (ref 135–145)

## 2014-07-19 LAB — CBC WITH DIFFERENTIAL/PLATELET
Basophils Absolute: 0 10*3/uL (ref 0.0–0.1)
Basophils Relative: 1 % (ref 0–1)
Eosinophils Absolute: 0.2 10*3/uL (ref 0.0–0.7)
Eosinophils Relative: 4 % (ref 0–5)
HCT: 36.1 % (ref 36.0–46.0)
Hemoglobin: 11.6 g/dL — ABNORMAL LOW (ref 12.0–15.0)
Lymphocytes Relative: 33 % (ref 12–46)
Lymphs Abs: 1.6 10*3/uL (ref 0.7–4.0)
MCH: 29.7 pg (ref 26.0–34.0)
MCHC: 32.1 g/dL (ref 30.0–36.0)
MCV: 92.6 fL (ref 78.0–100.0)
Monocytes Absolute: 0.5 10*3/uL (ref 0.1–1.0)
Monocytes Relative: 10 % (ref 3–12)
Neutro Abs: 2.5 10*3/uL (ref 1.7–7.7)
Neutrophils Relative %: 52 % (ref 43–77)
Platelets: 574 10*3/uL — ABNORMAL HIGH (ref 150–400)
RBC: 3.9 MIL/uL (ref 3.87–5.11)
RDW: 13.3 % (ref 11.5–15.5)
WBC: 4.8 10*3/uL (ref 4.0–10.5)

## 2014-07-19 LAB — VANCOMYCIN, TROUGH: Vancomycin Tr: 8 ug/mL — ABNORMAL LOW (ref 10.0–20.0)

## 2014-07-28 ENCOUNTER — Telehealth: Payer: Self-pay | Admitting: Infectious Disease

## 2014-07-28 ENCOUNTER — Telehealth: Payer: Self-pay | Admitting: Licensed Clinical Social Worker

## 2014-07-28 NOTE — Telephone Encounter (Signed)
[place] [facility] in critical labs of Creatinine 1.57 Vanc 48.6, pharmacy will follow protocol and hold Vanc and redraw tomorrow.

## 2014-07-28 NOTE — Telephone Encounter (Signed)
Patient needs to stop her ARB/ diuretic while in renal failure  We will also likely have to change her over to renally dosed daptomycin once vancomycin levels have come down

## 2014-07-29 ENCOUNTER — Telehealth: Payer: Self-pay | Admitting: Family Medicine

## 2014-07-29 ENCOUNTER — Other Ambulatory Visit (HOSPITAL_COMMUNITY)
Admission: AD | Admit: 2014-07-29 | Discharge: 2014-07-29 | Disposition: A | Discharge status: Home / Self Care | Payer: BLUE CROSS/BLUE SHIELD | Source: Other Acute Inpatient Hospital | Attending: Internal Medicine | Admitting: Internal Medicine

## 2014-07-29 DIAGNOSIS — Z5181 Encounter for therapeutic drug level monitoring: Secondary | ICD-10-CM | POA: Insufficient documentation

## 2014-07-29 LAB — BUN: BUN: 20 mg/dL (ref 6–20)

## 2014-07-29 LAB — CREATININE, SERUM
Creatinine, Ser: 1.44 mg/dL — ABNORMAL HIGH (ref 0.44–1.00)
GFR calc Af Amer: 45 mL/min — ABNORMAL LOW (ref >60)
GFR calc non Af Amer: 39 mL/min — ABNORMAL LOW (ref >60)

## 2014-07-29 LAB — VANCOMYCIN, RANDOM: Vancomycin Rm: 31 ug/mL

## 2014-07-29 MED ORDER — VALSARTAN 320 MG PO TABS: 320.0000 mg | ORAL_TABLET | Freq: Every day | ORAL | Status: DC

## 2014-07-29 NOTE — Telephone Encounter (Signed)
Yes this is okay. Please order appropriate medication and update medication list.

## 2014-07-29 NOTE — Telephone Encounter (Signed)
Pt made aware.  New rx to pharmacy

## 2014-07-29 NOTE — Telephone Encounter (Signed)
Has been told by her Infectious disease provider that she is having kidney problem.  Calling to say we need to take the HCTZ out of her BP med.  Needs Rx for plain Valsartan 320 mg to pharmacy.  OK?

## 2014-07-29 NOTE — Telephone Encounter (Signed)
Excellent

## 2014-07-29 NOTE — Telephone Encounter (Signed)
Patient notified

## 2014-07-30 ENCOUNTER — Telehealth: Payer: Self-pay | Admitting: *Deleted

## 2014-07-30 NOTE — Telephone Encounter (Signed)
Pharmacy from [facility] called to advise that the patient Vanc trough is still elevated at 31 and he has been holding meds since 07/28/14 (2days) Creat is 1.44 she wants to know what to do. Advised will send the doctor a message and she advised she is very concerned and would like to speak with the doctor herself so gave her the pager and advised will send message as well.

## 2014-07-31 ENCOUNTER — Other Ambulatory Visit (HOSPITAL_COMMUNITY)
Admission: RE | Admit: 2014-07-31 | Discharge: 2014-07-31 | Disposition: A | Discharge status: Home / Self Care | Payer: BLUE CROSS/BLUE SHIELD | Source: Ambulatory Visit | Attending: Internal Medicine | Admitting: Internal Medicine

## 2014-07-31 DIAGNOSIS — Z5181 Encounter for therapeutic drug level monitoring: Secondary | ICD-10-CM | POA: Insufficient documentation

## 2014-07-31 LAB — BUN: BUN: 21 mg/dL — ABNORMAL HIGH (ref 6–20)

## 2014-07-31 LAB — CREATININE, SERUM
Creatinine, Ser: 1.53 mg/dL — ABNORMAL HIGH (ref 0.44–1.00)
GFR calc Af Amer: 42 mL/min — ABNORMAL LOW (ref >60)
GFR calc non Af Amer: 36 mL/min — ABNORMAL LOW (ref >60)

## 2014-07-31 LAB — VANCOMYCIN, RANDOM: Vancomycin Rm: 10 ug/mL

## 2014-07-31 NOTE — Telephone Encounter (Signed)
Need to wait until vanco trough is below 15, likely just recheck Monday.  Creat sounds stable.  Not yet paged.  thanks

## 2014-07-31 NOTE — Telephone Encounter (Signed)
Called [facility] and gave the order. They will redraw the labs as ordered.

## 2014-08-04 ENCOUNTER — Encounter: Payer: Self-pay | Admitting: Infectious Disease

## 2014-08-04 ENCOUNTER — Ambulatory Visit (INDEPENDENT_AMBULATORY_CARE_PROVIDER_SITE_OTHER): Payer: BLUE CROSS/BLUE SHIELD | Admitting: Infectious Disease

## 2014-08-04 VITALS — BP 131/77 | HR 114 | Temp 97.7°F | Ht 63.0 in | Wt 262.0 lb

## 2014-08-04 DIAGNOSIS — I10 Essential (primary) hypertension: Secondary | ICD-10-CM

## 2014-08-04 DIAGNOSIS — L03031 Cellulitis of right toe: Secondary | ICD-10-CM

## 2014-08-04 DIAGNOSIS — N179 Acute kidney failure, unspecified: Secondary | ICD-10-CM | POA: Diagnosis not present

## 2014-08-04 DIAGNOSIS — L03115 Cellulitis of right lower limb: Secondary | ICD-10-CM

## 2014-08-04 DIAGNOSIS — N1419 Nephropathy induced by other drugs, medicaments and biological substances: Secondary | ICD-10-CM

## 2014-08-04 DIAGNOSIS — T368X5A Adverse effect of other systemic antibiotics, initial encounter: Secondary | ICD-10-CM

## 2014-08-04 DIAGNOSIS — N141 Nephropathy induced by other drugs, medicaments and biological substances: Secondary | ICD-10-CM

## 2014-08-04 DIAGNOSIS — N144 Toxic nephropathy, not elsewhere classified: Secondary | ICD-10-CM | POA: Diagnosis not present

## 2014-08-04 DIAGNOSIS — M4642 Discitis, unspecified, cervical region: Secondary | ICD-10-CM | POA: Diagnosis not present

## 2014-08-04 HISTORY — DX: Nephropathy induced by other drugs, medicaments and biological substances: N14.1

## 2014-08-04 HISTORY — DX: Adverse effect of other systemic antibiotics, initial encounter: T36.8X5A

## 2014-08-04 HISTORY — DX: Acute kidney failure, unspecified: N17.9

## 2014-08-04 HISTORY — DX: Nephropathy induced by other drugs, medicaments and biological substances: N14.19
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
## 2014-08-05 ENCOUNTER — Encounter: Payer: Self-pay | Admitting: Physician Assistant

## 2014-08-05 ENCOUNTER — Other Ambulatory Visit (HOSPITAL_COMMUNITY)
Admission: AD | Admit: 2014-08-05 | Discharge: 2014-08-05 | Disposition: A | Discharge status: Home / Self Care | Payer: BLUE CROSS/BLUE SHIELD | Source: Other Acute Inpatient Hospital | Attending: Internal Medicine | Admitting: Internal Medicine

## 2014-08-05 ENCOUNTER — Ambulatory Visit (INDEPENDENT_AMBULATORY_CARE_PROVIDER_SITE_OTHER): Payer: BLUE CROSS/BLUE SHIELD | Admitting: Physician Assistant

## 2014-08-05 VITALS — BP 122/70 | HR 82 | Temp 98.2°F | Resp 18 | Ht 63.0 in | Wt 272.0 lb

## 2014-08-05 DIAGNOSIS — M4642 Discitis, unspecified, cervical region: Secondary | ICD-10-CM

## 2014-08-05 DIAGNOSIS — N179 Acute kidney failure, unspecified: Secondary | ICD-10-CM | POA: Diagnosis not present

## 2014-08-05 DIAGNOSIS — N144 Toxic nephropathy, not elsewhere classified: Secondary | ICD-10-CM

## 2014-08-05 DIAGNOSIS — N141 Nephropathy induced by other drugs, medicaments and biological substances: Secondary | ICD-10-CM

## 2014-08-05 DIAGNOSIS — L03116 Cellulitis of left lower limb: Secondary | ICD-10-CM | POA: Diagnosis present

## 2014-08-05 DIAGNOSIS — I10 Essential (primary) hypertension: Secondary | ICD-10-CM

## 2014-08-05 DIAGNOSIS — N1419 Nephropathy induced by other drugs, medicaments and biological substances: Secondary | ICD-10-CM

## 2014-08-05 DIAGNOSIS — T368X5A Adverse effect of other systemic antibiotics, initial encounter: Secondary | ICD-10-CM

## 2014-08-05 LAB — COMPREHENSIVE METABOLIC PANEL
ALT: 13 U/L — ABNORMAL LOW (ref 14–54)
AST: 17 U/L (ref 15–41)
Albumin: 3.7 g/dL (ref 3.5–5.0)
Alkaline Phosphatase: 70 U/L (ref 38–126)
Anion gap: 8 (ref 5–15)
BUN: 21 mg/dL — ABNORMAL HIGH (ref 6–20)
CO2: 27 mmol/L (ref 22–32)
Calcium: 9.5 mg/dL (ref 8.9–10.3)
Chloride: 102 mmol/L (ref 101–111)
Creatinine, Ser: 1.18 mg/dL — ABNORMAL HIGH (ref 0.44–1.00)
GFR calc Af Amer: 57 mL/min — ABNORMAL LOW (ref >60)
GFR calc non Af Amer: 49 mL/min — ABNORMAL LOW (ref >60)
Glucose, Bld: 95 mg/dL (ref 65–99)
Potassium: 3.8 mmol/L (ref 3.5–5.1)
Sodium: 137 mmol/L (ref 135–145)
Total Bilirubin: 0.6 mg/dL (ref 0.3–1.2)
Total Protein: 6.8 g/dL (ref 6.5–8.1)

## 2014-08-05 LAB — CBC WITH DIFFERENTIAL/PLATELET
Basophils Absolute: 0.1 10*3/uL (ref 0.0–0.1)
Basophils Relative: 2 % — ABNORMAL HIGH (ref 0–1)
Eosinophils Absolute: 0.4 10*3/uL (ref 0.0–0.7)
Eosinophils Relative: 4 % (ref 0–5)
HCT: 35.4 % — ABNORMAL LOW (ref 36.0–46.0)
Hemoglobin: 11.7 g/dL — ABNORMAL LOW (ref 12.0–15.0)
Lymphocytes Relative: 25 % (ref 12–46)
Lymphs Abs: 2.1 10*3/uL (ref 0.7–4.0)
MCH: 29.5 pg (ref 26.0–34.0)
MCHC: 33.1 g/dL (ref 30.0–36.0)
MCV: 89.4 fL (ref 78.0–100.0)
Monocytes Absolute: 0.7 10*3/uL (ref 0.1–1.0)
Monocytes Relative: 8 % (ref 3–12)
Neutro Abs: 5 10*3/uL (ref 1.7–7.7)
Neutrophils Relative %: 61 % (ref 43–77)
Platelets: 377 10*3/uL (ref 150–400)
RBC: 3.96 MIL/uL (ref 3.87–5.11)
RDW: 13.2 % (ref 11.5–15.5)
WBC: 8.2 10*3/uL (ref 4.0–10.5)

## 2014-08-05 MED ORDER — NEBIVOLOL HCL 5 MG PO TABS: 5.0000 mg | ORAL_TABLET | Freq: Every day | ORAL | Status: DC

## 2014-08-05 NOTE — Progress Notes (Signed)
Patient ID: Lori Swanson MRN: [id number], DOB: 13-May-1955, 59 y.o. Date of Encounter: @DATE @  Chief Complaint:  Chief Complaint  Patient presents with  . Follow-up  . Still having problems with right foot swelling    HPI: 59 y.o. year old white female  presents with the above.   I reviewed my last office visit note 07/09/2014.  I reviewed admission H&P 07/14/2014  I reviewed hospital discharge summary 07/16/2014.  I reviewed infectious disease note 08/04/2014.  She had presented to orthopedics with neck pain. At initial evaluation had x-ray and then had subsequent MRI. Was diagnosed with cervical discitis. She was placed on IV vancomycin and oral ciprofloxacin and discharged home. She developed supratherapeutic vancomycin levels. Her creatinine increased to 1.5. Her vancomycin was held and then restarted at every other day. However infectious disease was concerned about vancomycin-related toxicity and did not want her to restart the vancomycin but instead change her to daptomycin. Also they were concerned that she was taking Diuretic, NSAID, aspirin, ARB.--Given Acute Renal Insufficiency.  She just had follow-up visit with infectious disease yesterday 08/04/2014. I have reviewed that note. Infectious disease is changing vancomycin to daptomycin. Given her acute renal failure they had already stopped the diuretic and at that visit they were stopping the ARB aspirin NSAIDs and vancomycin. They wanted her to follow-up here regarding anti-hypertensive medication. Felt that we should consider amlodipine or a beta blocker-- something that would not effect her acute renal failure.  Therefore patient is here to address this issue.  [facility]. As part of each of their visits they do check all vital signs including blood pressure so BP can be monitored by them.     Past Medical History  Diagnosis Date  . Hyperlipidemia   .  Hypertension   . Obesity   . Hyperglycemia   . Detached retina 06/23/2008  . Colon polyps   . Acute renal failure 08/04/2014  . Vancomycin-induced nephrotoxicity 08/04/2014     Home Meds: Outpatient Prescriptions Prior to Visit  Medication Sig Dispense Refill  . vitamin E 400 UNIT capsule Take 400 Units by mouth daily.    Marland Kitchen. aspirin 81 MG tablet Take 81 mg by mouth daily.    . Multiple Vitamin (MULTIVITAMIN) capsule Take 1 capsule by mouth daily. Takes a Centrum MVI daily    . Omega-3 Fatty Acids (FISH OIL) 1200 MG CAPS Take 2 capsules by mouth every morning.    . ciprofloxacin (CIPRO) 750 MG tablet Take 1 tablet (750 mg total) by mouth 2 (two) times daily. 84 tablet 0  . ondansetron (ZOFRAN) 4 MG tablet Take 1 tablet (4 mg total) by mouth every 8 (eight) hours as needed for nausea or vomiting. (Patient not taking: Reported on 08/04/2014) 20 tablet 0  . oxyCODONE-acetaminophen (PERCOCET) 10-325 MG per tablet Take 1 tablet by mouth every 4 (four) hours as needed for pain. (Patient not taking: Reported on 08/04/2014) 60 tablet 0  . polyethylene glycol (MIRALAX / GLYCOLAX) packet Take 17 g by mouth daily as needed for mild constipation. (Patient not taking: Reported on 08/04/2014) 14 each 0  . polyethylene glycol powder (GLYCOLAX) powder Take 17 g by mouth daily. (Patient not taking: Reported on 08/04/2014) 255 g 1  . tiZANidine (ZANAFLEX) 4 MG tablet TAKE 1 TABLET BY MOUTH EVERY 8 HOURS AS NEEDED FOR SPASM  0  . vancomycin (VANCOCIN) 1 GM/200ML SOLN Inject 200 mLs (1,000 mg total)  into the vein every 12 (twelve) hours. (Patient not taking: Reported on 08/04/2014) 4000 mL 4   No facility-administered medications prior to visit.    Allergies:  Allergies  Allergen Reactions  . Iodine Swelling    History   Social History  . Marital Status: Single    Spouse Name: N/A  . Number of Children: N/A  . Years of Education: N/A   Occupational History  . Not on file.   Social History Main Topics    . Smoking status: Never Smoker   . Smokeless tobacco: Never Used  . Alcohol Use: No  . Drug Use: No  . Sexual Activity: Not on file   Other Topics Concern  . Not on file   Social History Narrative    Family History  Problem Relation Age of Onset  . Diabetes Mother   . Heart disease Mother     MI, CAD  . Cancer Mother     breast  . Hypertension Mother   . Cancer Maternal Uncle     throat  . Cancer Maternal Uncle     bone  . Cancer Father     lung  . Hypertension Father   . Heart disease Father     pacemaker, MI in 30's     Review of Systems:  See HPI for pertinent ROS. All other ROS negative.    Physical Exam: Blood pressure 122/70, pulse 82, temperature 98.2 F (36.8 C), temperature source Oral, resp. rate 18, height  (1.6 m), weight 272 lb (123.378 kg)., Body mass index is 48.19 kg/(m^2). General: [name]. Appears in no acute distress. Neck: Supple. No thyromegaly. No lymphadenopathy. Lungs: Clear bilaterally to auscultation without wheezes, rales, or rhonchi. Breathing is unlabored. Heart: RRR with S1 S2. No murmurs, rubs, or gallops. Musculoskeletal:  Strength and tone normal for age. Extremities/Skin: Boot on right foot/leg Neuro: Alert and oriented X 3. Moves all extremities spontaneously. Gait is normal. CNII-XII grossly in tact. Psych:  Responds to questions appropriately with a normal affect.     ASSESSMENT AND PLAN:  59 y.o. year old female with  1. Essential hypertension Given her recent acute renal failure need to avoid Diuretics, ACE Inh, ARB.  Given that she says she is still having swelling in right foot/leg, will avoid Norvasc.  We'll add Bystolic. Gave her a savings card. [facility] is coming to her house routinely and checks blood pressure is part of their evaluation. Pt can call us or schedule follow-up if they are getting blood pressure readings consistently too low or too high. Otherwise they can monitor and patient can wait  to come back here for office visit after this IV anabiotic treatment is complete. - nebivolol (BYSTOLIC) 5 MG tablet; Take 1 tablet (5 mg total) by mouth daily.  Dispense: 30 tablet; Refill: 11  2. Acute renal failure, unspecified acute renal failure type  3. Cervical discitis  4. Vancomycin-induced nephrotoxicity   Signed, [address] [place], [place], [facility] 08/05/2014 4:59 PM

## 2014-08-10 ENCOUNTER — Other Ambulatory Visit (HOSPITAL_COMMUNITY)
Admission: RE | Admit: 2014-08-10 | Discharge: 2014-08-10 | Disposition: A | Discharge status: Home / Self Care | Payer: BLUE CROSS/BLUE SHIELD | Source: Skilled Nursing Facility | Attending: Internal Medicine | Admitting: Internal Medicine

## 2014-08-10 DIAGNOSIS — Z5181 Encounter for therapeutic drug level monitoring: Secondary | ICD-10-CM | POA: Insufficient documentation

## 2014-08-10 LAB — CBC WITH DIFFERENTIAL/PLATELET
Basophils Absolute: 0.1 10*3/uL (ref 0.0–0.1)
Basophils Relative: 2 % — ABNORMAL HIGH (ref 0–1)
Eosinophils Absolute: 0.2 10*3/uL (ref 0.0–0.7)
Eosinophils Relative: 3 % (ref 0–5)
HCT: 33.2 % — ABNORMAL LOW (ref 36.0–46.0)
Hemoglobin: 10.8 g/dL — ABNORMAL LOW (ref 12.0–15.0)
Lymphocytes Relative: 30 % (ref 12–46)
Lymphs Abs: 1.7 10*3/uL (ref 0.7–4.0)
MCH: 29.4 pg (ref 26.0–34.0)
MCHC: 32.5 g/dL (ref 30.0–36.0)
MCV: 90.5 fL (ref 78.0–100.0)
Monocytes Absolute: 0.6 10*3/uL (ref 0.1–1.0)
Monocytes Relative: 11 % (ref 3–12)
Neutro Abs: 2.9 10*3/uL (ref 1.7–7.7)
Neutrophils Relative %: 54 % (ref 43–77)
Platelets: 333 10*3/uL (ref 150–400)
RBC: 3.67 MIL/uL — ABNORMAL LOW (ref 3.87–5.11)
RDW: 13.7 % (ref 11.5–15.5)
WBC: 5.5 10*3/uL (ref 4.0–10.5)

## 2014-08-10 LAB — BASIC METABOLIC PANEL
Anion gap: 9 (ref 5–15)
BUN: 17 mg/dL (ref 6–20)
CO2: 26 mmol/L (ref 22–32)
Calcium: 8.7 mg/dL — ABNORMAL LOW (ref 8.9–10.3)
Chloride: 106 mmol/L (ref 101–111)
Creatinine, Ser: 1.18 mg/dL — ABNORMAL HIGH (ref 0.44–1.00)
GFR calc Af Amer: 57 mL/min — ABNORMAL LOW (ref >60)
GFR calc non Af Amer: 49 mL/min — ABNORMAL LOW (ref >60)
Glucose, Bld: 98 mg/dL (ref 65–99)
Potassium: 3.3 mmol/L — ABNORMAL LOW (ref 3.5–5.1)
Sodium: 141 mmol/L (ref 135–145)

## 2014-08-10 LAB — CK: Total CK: 67 U/L (ref 38–234)

## 2014-08-17 ENCOUNTER — Ambulatory Visit (INDEPENDENT_AMBULATORY_CARE_PROVIDER_SITE_OTHER): Payer: BLUE CROSS/BLUE SHIELD | Admitting: Physician Assistant

## 2014-08-17 ENCOUNTER — Other Ambulatory Visit (HOSPITAL_COMMUNITY)
Admission: AD | Admit: 2014-08-17 | Discharge: 2014-08-17 | Disposition: A | Discharge status: Home / Self Care | Payer: BLUE CROSS/BLUE SHIELD | Source: Other Acute Inpatient Hospital | Attending: Internal Medicine | Admitting: Internal Medicine

## 2014-08-17 ENCOUNTER — Ambulatory Visit: Payer: BLUE CROSS/BLUE SHIELD | Admitting: Physician Assistant

## 2014-08-17 ENCOUNTER — Encounter: Payer: Self-pay | Admitting: Physician Assistant

## 2014-08-17 VITALS — BP 150/90 | HR 60 | Temp 97.7°F | Resp 16 | Wt 281.0 lb

## 2014-08-17 DIAGNOSIS — N179 Acute kidney failure, unspecified: Secondary | ICD-10-CM | POA: Diagnosis not present

## 2014-08-17 DIAGNOSIS — N1419 Nephropathy induced by other drugs, medicaments and biological substances: Secondary | ICD-10-CM

## 2014-08-17 DIAGNOSIS — N144 Toxic nephropathy, not elsewhere classified: Secondary | ICD-10-CM | POA: Diagnosis not present

## 2014-08-17 DIAGNOSIS — Z5181 Encounter for therapeutic drug level monitoring: Secondary | ICD-10-CM | POA: Diagnosis present

## 2014-08-17 DIAGNOSIS — N141 Nephropathy induced by other drugs, medicaments and biological substances: Secondary | ICD-10-CM

## 2014-08-17 DIAGNOSIS — I10 Essential (primary) hypertension: Secondary | ICD-10-CM

## 2014-08-17 DIAGNOSIS — T368X5A Adverse effect of other systemic antibiotics, initial encounter: Principal | ICD-10-CM

## 2014-08-17 DIAGNOSIS — M4642 Discitis, unspecified, cervical region: Secondary | ICD-10-CM | POA: Diagnosis not present

## 2014-08-17 LAB — CBC WITH DIFFERENTIAL/PLATELET
Basophils Absolute: 0.1 10*3/uL (ref 0.0–0.1)
Basophils Relative: 1 % (ref 0–1)
Eosinophils Absolute: 0.2 10*3/uL (ref 0.0–0.7)
Eosinophils Relative: 4 % (ref 0–5)
HCT: 32.9 % — ABNORMAL LOW (ref 36.0–46.0)
Hemoglobin: 10.4 g/dL — ABNORMAL LOW (ref 12.0–15.0)
Lymphocytes Relative: 35 % (ref 12–46)
Lymphs Abs: 1.7 10*3/uL (ref 0.7–4.0)
MCH: 28.7 pg (ref 26.0–34.0)
MCHC: 31.6 g/dL (ref 30.0–36.0)
MCV: 90.9 fL (ref 78.0–100.0)
Monocytes Absolute: 0.4 10*3/uL (ref 0.1–1.0)
Monocytes Relative: 9 % (ref 3–12)
Neutro Abs: 2.5 10*3/uL (ref 1.7–7.7)
Neutrophils Relative %: 51 % (ref 43–77)
Platelets: 287 10*3/uL (ref 150–400)
RBC: 3.62 MIL/uL — ABNORMAL LOW (ref 3.87–5.11)
RDW: 13.7 % (ref 11.5–15.5)
WBC: 4.8 10*3/uL (ref 4.0–10.5)

## 2014-08-17 LAB — BASIC METABOLIC PANEL
Anion gap: 9 (ref 5–15)
BUN: 15 mg/dL (ref 6–20)
CO2: 26 mmol/L (ref 22–32)
Calcium: 8.6 mg/dL — ABNORMAL LOW (ref 8.9–10.3)
Chloride: 106 mmol/L (ref 101–111)
Creatinine, Ser: 1.03 mg/dL — ABNORMAL HIGH (ref 0.44–1.00)
GFR calc Af Amer: >60 mL/min (ref >60)
GFR calc non Af Amer: 58 mL/min — ABNORMAL LOW (ref >60)
Glucose, Bld: 97 mg/dL (ref 65–99)
Potassium: 3.9 mmol/L (ref 3.5–5.1)
Sodium: 141 mmol/L (ref 135–145)

## 2014-08-17 LAB — CK: Total CK: 180 U/L (ref 38–234)

## 2014-08-17 MED ORDER — NEBIVOLOL HCL 10 MG PO TABS: 10.0000 mg | ORAL_TABLET | Freq: Every day | ORAL | Status: DC

## 2014-08-17 NOTE — Progress Notes (Signed)
Patient ID: Lori Swanson MRN: [id number], DOB: 12/10/1955, 59 y.o. Date of Encounter: @DATE @  Chief Complaint:  Chief Complaint  Patient presents with  . Edema    in bilateral but little in left leg  . OTHER    BP elevated    HPI: 60 y.o. year old white female  presents with the above.   THE FOLLOWING IS COPIED FROM MY OV NOTE 08/05/2014:  I reviewed my office visit note 07/09/2014.  I reviewed admission H&P 07/14/2014  I reviewed hospital discharge summary 07/16/2014.  I reviewed infectious disease note 08/04/2014.  She had presented to orthopedics with neck pain. At initial evaluation had x-ray and then had subsequent MRI. Was diagnosed with cervical discitis. She was placed on IV vancomycin and oral ciprofloxacin and discharged home. She developed supratherapeutic vancomycin levels. Her creatinine increased to 1.5. Her vancomycin was held and then restarted at every other day. However infectious disease was concerned about vancomycin-related toxicity and did not want her to restart the vancomycin but instead change her to daptomycin. Also they were concerned that she was taking Diuretic, NSAID, aspirin, ARB.--Given Acute Renal Insufficiency.  She just had follow-up visit with infectious disease yesterday 08/04/2014. I have reviewed that note. Infectious disease is changing vancomycin to daptomycin. Given her acute renal failure they had already stopped the diuretic and at that visit they were stopping the ARB aspirin NSAIDs and vancomycin. They wanted her to follow-up here regarding anti-hypertensive medication. Felt that we should consider amlodipine or a beta blocker-- something that would not effect her acute renal failure.  Therefore patient is here to address this issue.  She states that she has advanced home care coming to her house. As part of each of their visits they do check all vital signs including blood pressure so BP can be monitored by them.   AT THAT  OV--I PRESCRIBED BYSTOLIC 5MG  QD  TODAY--08/17/2014:  Patient states that she is taking the Bystolic 5 mg daily. States that she gets it for $30. Having no adverse effects. Health nurse getting her blood pressure elevated at about 154/98. Also her right foot and lower leg are still swelling. No other complaints or concerns.    Past Medical History  Diagnosis Date  . Hyperlipidemia   . Hypertension   . Obesity   . Hyperglycemia   . Detached retina 06/23/2008  . Colon polyps   . Acute renal failure 08/04/2014  . Vancomycin-induced nephrotoxicity 08/04/2014     Home Meds: Outpatient Prescriptions Prior to Visit  Medication Sig Dispense Refill  . ciprofloxacin (CIPRO) 750 MG tablet Take 750 mg by mouth 2 (two) times daily.     . nebivolol (BYSTOLIC) 5 MG tablet Take 1 tablet (5 mg total) by mouth daily. 30 tablet 11  . aspirin 81 MG tablet Take 81 mg by mouth daily.    . Multiple Vitamin (MULTIVITAMIN) capsule Take 1 capsule by mouth daily. Takes a Centrum MVI daily    . Omega-3 Fatty Acids (FISH OIL) 1200 MG CAPS Take 2 capsules by mouth every morning.    . vitamin E 400 UNIT capsule Take 400 Units by mouth daily.     No facility-administered medications prior to visit.    Allergies:  Allergies  Allergen Reactions  . Iodine Swelling    History   Social History  . Marital Status: Single    Spouse Name: N/A  . Number of Children: N/A  . Years of Education: N/A   Occupational History  .  Not on file.   Social History Main Topics  . Smoking status: Never Smoker   . Smokeless tobacco: Never Used  . Alcohol Use: No  . Drug Use: No  . Sexual Activity: Not on file   Other Topics Concern  . Not on file   Social History Narrative    Family History  Problem Relation Age of Onset  . Diabetes Mother   . Heart disease Mother     MI, CAD  . Cancer Mother     breast  . Hypertension Mother   . Cancer Maternal Uncle     throat  . Cancer Maternal Uncle     bone  .  Cancer Father     lung  . Hypertension Father   . Heart disease Father     pacemaker, MI in 42's     Review of Systems:  See HPI for pertinent ROS. All other ROS negative.    Physical Exam: Blood pressure 150/90, pulse 60, temperature 97.7 F (36.5 C), temperature source Oral, resp. rate 16, weight 281 lb (127.461 kg)., Body mass index is 49.79 kg/(m^2). General: [name]. Appears in no acute distress. Neck: Supple. No thyromegaly. No lymphadenopathy. Lungs: Clear bilaterally to auscultation without wheezes, rales, or rhonchi. Breathing is unlabored. Heart: RRR with S1 S2. No murmurs, rubs, or gallops. Musculoskeletal:  Strength and tone normal for age. Extremities/Skin: Boot on right foot/leg. She removes boot. Right foot with 2 + pitting edema. At ankle level, 2+ pitting edema. Above ankle level, gradually decreases to trace edema 1/3 way up shin.  Neuro: Alert and oriented X 3. Moves all extremities spontaneously. Gait is normal. CNII-XII grossly in tact. Psych:  Responds to questions appropriately with a normal affect.     ASSESSMENT AND PLAN:  59 y.o. year old female with  1. Essential hypertension Given her recent acute renal failure need to avoid Diuretics, ACE Inh, ARB.  Given that she says she is still having swelling in right foot/leg, will avoid Norvasc.  BP uncontrolled.  Increase Bystolic to  QD.  HHN to check BP--Pt to call me if BP > 140/90  - nebivolol (BYSTOLIC) 10 MG tablet; Take 1 tablet (10 mg total) by mouth daily.  Dispense: 30 tablet; Refill: 3  2. Edema of Right Foot/ Lower Leg: Have to avoid Diuretic until ARF resolves/ kidney function improves.  Pt says she went for repeat lab today (oredered by ID) Will f/u these results then decide if she can use diuretic.  O/W discussed elevating foot above heart level. Discussed Compression stockings.   3. Vancomycin-induced nephrotoxicity  4. Acute renal failure, unspecified acute renal failure type   5.  Cervical discitis     Signed, Shon Hale [place], [place], [facility] 08/17/2014 4:34 PM

## 2014-08-24 ENCOUNTER — Telehealth: Payer: Self-pay | Admitting: Licensed Clinical Social Worker

## 2014-08-24 ENCOUNTER — Telehealth: Payer: Self-pay | Admitting: Family Medicine

## 2014-08-24 ENCOUNTER — Other Ambulatory Visit (HOSPITAL_COMMUNITY)
Admission: RE | Admit: 2014-08-24 | Discharge: 2014-08-24 | Disposition: A | Discharge status: Home / Self Care | Payer: BLUE CROSS/BLUE SHIELD | Source: Other Acute Inpatient Hospital | Attending: Internal Medicine | Admitting: Internal Medicine

## 2014-08-24 DIAGNOSIS — Z5181 Encounter for therapeutic drug level monitoring: Secondary | ICD-10-CM | POA: Diagnosis present

## 2014-08-24 LAB — CBC WITH DIFFERENTIAL/PLATELET
Basophils Absolute: 0 10*3/uL (ref 0.0–0.1)
Basophils Relative: 1 % (ref 0–1)
Eosinophils Absolute: 0.2 10*3/uL (ref 0.0–0.7)
Eosinophils Relative: 3 % (ref 0–5)
HCT: 31.2 % — ABNORMAL LOW (ref 36.0–46.0)
Hemoglobin: 10 g/dL — ABNORMAL LOW (ref 12.0–15.0)
Lymphocytes Relative: 30 % (ref 12–46)
Lymphs Abs: 1.5 10*3/uL (ref 0.7–4.0)
MCH: 29 pg (ref 26.0–34.0)
MCHC: 32.1 g/dL (ref 30.0–36.0)
MCV: 90.4 fL (ref 78.0–100.0)
Monocytes Absolute: 0.5 10*3/uL (ref 0.1–1.0)
Monocytes Relative: 11 % (ref 3–12)
Neutro Abs: 2.6 10*3/uL (ref 1.7–7.7)
Neutrophils Relative %: 55 % (ref 43–77)
Platelets: 269 10*3/uL (ref 150–400)
RBC: 3.45 MIL/uL — ABNORMAL LOW (ref 3.87–5.11)
RDW: 13.9 % (ref 11.5–15.5)
WBC: 4.8 10*3/uL (ref 4.0–10.5)

## 2014-08-24 LAB — BASIC METABOLIC PANEL
Anion gap: 8 (ref 5–15)
BUN: 17 mg/dL (ref 6–20)
CO2: 26 mmol/L (ref 22–32)
Calcium: 8.8 mg/dL — ABNORMAL LOW (ref 8.9–10.3)
Chloride: 108 mmol/L (ref 101–111)
Creatinine, Ser: 0.87 mg/dL (ref 0.44–1.00)
GFR calc Af Amer: >60 mL/min (ref >60)
GFR calc non Af Amer: >60 mL/min (ref >60)
Glucose, Bld: 91 mg/dL (ref 65–99)
Potassium: 3.7 mmol/L (ref 3.5–5.1)
Sodium: 142 mmol/L (ref 135–145)

## 2014-08-24 LAB — CK: Total CK: 134 U/L (ref 38–234)

## 2014-08-24 MED ORDER — POTASSIUM CHLORIDE ER 10 MEQ PO TBCR: EXTENDED_RELEASE_TABLET | ORAL | Status: DC

## 2014-08-24 MED ORDER — FUROSEMIDE 20 MG PO TABS: ORAL_TABLET | ORAL | Status: DC

## 2014-08-24 NOTE — Telephone Encounter (Signed)
Probably just keep abx running thru the 24th

## 2014-08-24 NOTE — Telephone Encounter (Signed)
Home Health Nurse called. She is at patient's house (Pt on IV abx and having labs to f/u Acute Renal Insuff after Vanc) HHN reports pt still with significant swelling in Right LE and now also has edema Left LE. Now 2+ edema bilaterally and renal function was back to near normal at last lab check 08/17/2014.  She is rechecking labs today. Told her to tell patient that I will send prescription for diabetic and potassium to [facility] and for patient to go pick those up and start them today. Also I will make sure to follow up the lab results from today to make sure that renal function back to normal. Patient to schedule follow-up visit with me here in the next 1-2 weeks. Will call in prescription for the following:  Lasix 20 mg: ----Take 2 every morning for 3 days and then 1 a day Potassium chloride 10 mEq --- Take 2 every morning for 3 days then 1 a day

## 2014-08-24 NOTE — Telephone Encounter (Signed)
I cannot force her to keep taking the antibiotics. Hopefully she does fine off of them If she insists on coming off after stop date then picc can be pulled at that time and FU with [place] in clinic

## 2014-08-24 NOTE — Telephone Encounter (Signed)
Patient is due to end iv abx therapy this Thursday, but per Dr. Daiva Eves hospital note he would like to see the patient before discontinuing medication. The patient's appointment is not until 8/24. Please advise.

## 2014-08-24 NOTE — Telephone Encounter (Signed)
Patient does not want to do that, can it be pulled this week?

## 2014-08-25 NOTE — Telephone Encounter (Signed)
excelllent

## 2014-08-25 NOTE — Telephone Encounter (Signed)
I left message on nurse voicemail

## 2014-08-25 NOTE — Telephone Encounter (Signed)
That's fine thanks Lori Swanson!

## 2014-08-25 NOTE — Telephone Encounter (Signed)
Patient agreed to stay on Cubicin until 8/24

## 2014-09-01 ENCOUNTER — Encounter: Payer: Self-pay | Admitting: Infectious Disease

## 2014-09-09 ENCOUNTER — Encounter: Payer: Self-pay | Admitting: Infectious Disease

## 2014-09-09 ENCOUNTER — Ambulatory Visit (INDEPENDENT_AMBULATORY_CARE_PROVIDER_SITE_OTHER): Payer: BLUE CROSS/BLUE SHIELD | Admitting: Infectious Disease

## 2014-09-09 VITALS — BP 140/87 | HR 64 | Temp 98.2°F | Wt 275.0 lb

## 2014-09-09 DIAGNOSIS — Z23 Encounter for immunization: Secondary | ICD-10-CM

## 2014-09-09 DIAGNOSIS — T368X5A Adverse effect of other systemic antibiotics, initial encounter: Secondary | ICD-10-CM

## 2014-09-09 DIAGNOSIS — M4642 Discitis, unspecified, cervical region: Secondary | ICD-10-CM

## 2014-09-09 DIAGNOSIS — N144 Toxic nephropathy, not elsewhere classified: Secondary | ICD-10-CM

## 2014-09-09 DIAGNOSIS — N141 Nephropathy induced by other drugs, medicaments and biological substances: Secondary | ICD-10-CM

## 2014-09-09 DIAGNOSIS — N179 Acute kidney failure, unspecified: Secondary | ICD-10-CM | POA: Diagnosis not present

## 2014-09-09 DIAGNOSIS — M79671 Pain in right foot: Secondary | ICD-10-CM

## 2014-09-09 DIAGNOSIS — N1419 Nephropathy induced by other drugs, medicaments and biological substances: Secondary | ICD-10-CM

## 2014-09-09 HISTORY — DX: Pain in right foot: M79.671

## 2014-09-09 LAB — SEDIMENTATION RATE: Sed Rate: 13 mm/hr (ref 0–30)

## 2014-09-09 LAB — C-REACTIVE PROTEIN: CRP: <0.5 mg/dL (ref <0.60)

## 2014-09-09 NOTE — Progress Notes (Signed)
Subjective:    Patient ID: Lori Swanson, female    DOB: [birth year], 59 y.o.   MRN: [id number]  HPI   59 year old lady with hypertension and obesity who developed cervical spine discitis that was discovered on MRI done  orthopedics. She was thought to have cellulitis of the potential portal of entry. I suspect her cervical spine was not aspirated due to the fact that radiology is not comfortable aspirating that part of the spine and due the fact that surgery did not intervene. She was placed on a regimen of intravenous vancomycin and oral ciprofloxacin and discharged home. Unfortunately she developed supratherapeutic vancomycin levels up to 48. Her creatinine increased up to 1.5. Her vancomycin was held and now has come down to 10. She apparently been restarted on vancomycin every other day. I however was concerned about vancomycin-related toxicity and actually had wanted her not to restart her vancomycin but to change her over to daptomycin I also had  been concern by the fact that she was taking non-steroidal anti-inflammatory drugs along with a diarrhetic and an aged tensor receptor blocker. I asked that all these drugs be stopped but as mentioned the vancomycin was restarted and the ARB continued. When I saw her in clinic she already had dramatic improvement in her neck pain. I changed her over to daptomycin.  She received her last planned dose yesterday and continues to feel very good.  She still does have foot pain esp with flexion and dorsiflexion and still wearing a protective boot.   Review of Systems  Constitutional: Negative for fever, chills, diaphoresis, activity change, appetite change, fatigue and unexpected weight change.  HENT: Negative for congestion, rhinorrhea, sinus pressure, sneezing, sore throat and trouble swallowing.   Eyes: Negative for photophobia and visual disturbance.  Respiratory: Negative for cough, chest tightness, shortness of breath, wheezing and stridor.     Cardiovascular: Negative for chest pain, palpitations and leg swelling.  Gastrointestinal: Negative for nausea, vomiting, abdominal pain, diarrhea, constipation, blood in stool, abdominal distention and anal bleeding.  Genitourinary: Negative for dysuria, hematuria, flank pain and difficulty urinating.  Musculoskeletal: Positive for arthralgias. Negative for myalgias, joint swelling and gait problem.  Skin: Negative for color change, pallor, rash and wound.  Neurological: Negative for dizziness, tremors, weakness and light-headedness.  Hematological: Negative for adenopathy. Does not bruise/bleed easily.  Psychiatric/Behavioral: Negative for behavioral problems, confusion, sleep disturbance, dysphoric mood, decreased concentration and agitation.       Objective:   Physical Exam  Constitutional: She is oriented to person, place, and time. She appears well-developed and well-nourished. No distress.  HENT:  Head: Normocephalic and atraumatic.  Mouth/Throat: No oropharyngeal exudate.  Eyes: Conjunctivae and EOM are normal. No scleral icterus.  Neck: Normal range of motion. Neck supple.  Cardiovascular: Normal rate, regular rhythm and normal heart sounds.  Exam reveals no friction rub.   No murmur heard. Pulmonary/Chest: Effort normal and breath sounds normal. No respiratory distress. She has no wheezes. She has no rales.  Abdominal: She exhibits no distension. There is no tenderness.  Musculoskeletal: She exhibits no edema or tenderness.       Feet:  Neurological: She is alert and oriented to person, place, and time. She exhibits normal muscle tone. Coordination normal.  Skin: Skin is warm and dry. No rash noted. She is not diaphoretic. No erythema. No pallor.     Psychiatric: She has a normal mood and affect. Her behavior is normal. Judgment and thought content normal.  Assessment & Plan:   Cervical diskitis: changed the vancomycin to daptomycin at 8 mg/kg and has  completed an 8 week course  --DC PICC --recheck ESR, CRP  Acute renal failure: likely multifactorial with potential prerenal failure in the setting of diarrhetic's angiotensin receptor blockers aspirin non-steroidal anti-inflammatory drugs and supratherapeutic therapeutic vancomycin levels: no resolved  Cellulitis and foot pain: No cellulitis at present but definite foot pain and tenderness will get MRI right foot.  I spent greater than 40 minutes with the patient including greater than 50% of time in face to face counsel of the patient Regarding her discitis cellulitis foot pain and recent acute renal failure and  in coordination of their care.

## 2014-09-18 ENCOUNTER — Ambulatory Visit (HOSPITAL_COMMUNITY)
Admission: RE | Admit: 2014-09-18 | Discharge: 2014-09-18 | Disposition: A | Discharge status: Home / Self Care | Payer: BLUE CROSS/BLUE SHIELD | Source: Ambulatory Visit | Attending: Infectious Disease | Admitting: Infectious Disease

## 2014-09-18 ENCOUNTER — Ambulatory Visit (HOSPITAL_COMMUNITY): Payer: BLUE CROSS/BLUE SHIELD

## 2014-09-18 DIAGNOSIS — M12871 Other specific arthropathies, not elsewhere classified, right ankle and foot: Secondary | ICD-10-CM | POA: Diagnosis not present

## 2014-09-18 DIAGNOSIS — M7989 Other specified soft tissue disorders: Secondary | ICD-10-CM | POA: Insufficient documentation

## 2014-09-18 DIAGNOSIS — M79671 Pain in right foot: Secondary | ICD-10-CM | POA: Diagnosis present

## 2014-09-18 MED ORDER — GADOBENATE DIMEGLUMINE 529 MG/ML IV SOLN
20.0000 mL | Freq: Once | INTRAVENOUS | Status: DC | PRN
  Administered 2014-09-18: 20 mL via INTRAVENOUS
  Filled 2014-09-18: qty 20

## 2014-09-20 ENCOUNTER — Other Ambulatory Visit: Payer: Self-pay | Admitting: Physician Assistant

## 2014-09-22 ENCOUNTER — Other Ambulatory Visit: Payer: Self-pay | Admitting: Physician Assistant

## 2014-09-22 NOTE — Telephone Encounter (Signed)
Medication refilled per protocol. 

## 2014-09-23 ENCOUNTER — Telehealth: Payer: Self-pay | Admitting: *Deleted

## 2014-09-23 NOTE — Telephone Encounter (Signed)
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 If any questions please call [phone]  I have routed this to Frazier Richards for review

## 2014-09-28 NOTE — Telephone Encounter (Signed)
Paper work completed, given to [employer].

## 2014-09-28 NOTE — Telephone Encounter (Signed)
Paperwork has been faxed and confirmation recieved

## 2014-10-14 ENCOUNTER — Ambulatory Visit: Payer: BLUE CROSS/BLUE SHIELD | Admitting: Physician Assistant

## 2014-10-22 ENCOUNTER — Other Ambulatory Visit: Payer: Self-pay | Admitting: Physician Assistant

## 2014-10-22 ENCOUNTER — Encounter: Payer: Self-pay | Admitting: Family Medicine

## 2014-10-22 NOTE — Telephone Encounter (Signed)
Medication refill for one time only.  Patient needs to be seen.  Letter sent for patient to call and schedule 

## 2014-12-13 ENCOUNTER — Other Ambulatory Visit: Payer: Self-pay | Admitting: Physician Assistant

## 2014-12-14 NOTE — Telephone Encounter (Signed)
Medication refill for one time only.  Patient needs to be seen.  

## 2014-12-19 ENCOUNTER — Other Ambulatory Visit: Payer: Self-pay | Admitting: Physician Assistant

## 2014-12-21 ENCOUNTER — Encounter: Payer: Self-pay | Admitting: Family Medicine

## 2014-12-21 NOTE — Telephone Encounter (Signed)
Medication refill for one time only.  Patient needs to be seen.  Letter sent for patient to call and schedule 

## 2015-01-08 ENCOUNTER — Encounter: Payer: Self-pay | Admitting: Family Medicine

## 2015-01-08 ENCOUNTER — Other Ambulatory Visit: Payer: Self-pay | Admitting: Physician Assistant

## 2015-01-08 NOTE — Telephone Encounter (Signed)
Medication refill for one time only.  Patient needs to be seen.  Letter sent for patient to call and schedule 

## 2015-01-21 ENCOUNTER — Ambulatory Visit (INDEPENDENT_AMBULATORY_CARE_PROVIDER_SITE_OTHER): Payer: BLUE CROSS/BLUE SHIELD | Admitting: Physician Assistant

## 2015-01-21 ENCOUNTER — Encounter: Payer: Self-pay | Admitting: Physician Assistant

## 2015-01-21 VITALS — BP 144/92 | HR 80 | Temp 98.2°F | Resp 18 | Wt 275.0 lb

## 2015-01-21 DIAGNOSIS — I10 Essential (primary) hypertension: Secondary | ICD-10-CM

## 2015-01-21 DIAGNOSIS — R739 Hyperglycemia, unspecified: Secondary | ICD-10-CM | POA: Diagnosis not present

## 2015-01-21 DIAGNOSIS — E785 Hyperlipidemia, unspecified: Secondary | ICD-10-CM

## 2015-01-21 DIAGNOSIS — E669 Obesity, unspecified: Secondary | ICD-10-CM | POA: Diagnosis not present

## 2015-01-21 MED ORDER — VALSARTAN 320 MG PO TABS: 320.0000 mg | ORAL_TABLET | Freq: Every day | ORAL | Status: DC
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
## 2015-01-22 ENCOUNTER — Other Ambulatory Visit: Payer: Self-pay | Admitting: Family Medicine

## 2015-01-22 LAB — COMPLETE METABOLIC PANEL WITH GFR
ALT: 11 U/L (ref 6–29)
AST: 12 U/L (ref 10–35)
Albumin: 4.1 g/dL (ref 3.6–5.1)
Alkaline Phosphatase: 75 U/L (ref 33–130)
BUN: 15 mg/dL (ref 7–25)
CO2: 23 mmol/L (ref 20–31)
Calcium: 9.6 mg/dL (ref 8.6–10.4)
Chloride: 99 mmol/L (ref 98–110)
Creat: 0.66 mg/dL (ref 0.50–1.05)
GFR, Est African American: >89 mL/min (ref >=60)
GFR, Est Non African American: >89 mL/min (ref >=60)
Glucose, Bld: 114 mg/dL — ABNORMAL HIGH (ref 70–99)
Potassium: 4.3 mmol/L (ref 3.5–5.3)
Sodium: 137 mmol/L (ref 135–146)
Total Bilirubin: 0.7 mg/dL (ref 0.2–1.2)
Total Protein: 6.9 g/dL (ref 6.1–8.1)

## 2015-01-22 LAB — LIPID PANEL
Cholesterol: 201 mg/dL — ABNORMAL HIGH (ref 125–200)
HDL: 51 mg/dL (ref >=46)
LDL Cholesterol: 113 mg/dL (ref <130)
Total CHOL/HDL Ratio: 3.9 Ratio (ref <=5.0)
Triglycerides: 184 mg/dL — ABNORMAL HIGH (ref <150)
VLDL: 37 mg/dL — ABNORMAL HIGH (ref <30)

## 2015-01-22 LAB — MICROALBUMIN, URINE: Microalb, Ur: <0.2 mg/dL

## 2015-01-22 LAB — HEMOGLOBIN A1C
Hgb A1c MFr Bld: 6.1 % — ABNORMAL HIGH (ref <5.7)
Mean Plasma Glucose: 128 mg/dL — ABNORMAL HIGH (ref <117)

## 2015-01-22 MED ORDER — FUROSEMIDE 20 MG PO TABS: ORAL_TABLET | ORAL | Status: DC

## 2015-01-22 NOTE — Telephone Encounter (Signed)
Medication refilled per protocol. 

## 2015-01-25 ENCOUNTER — Encounter: Payer: Self-pay | Admitting: Family Medicine

## 2015-02-04 ENCOUNTER — Ambulatory Visit (INDEPENDENT_AMBULATORY_CARE_PROVIDER_SITE_OTHER): Payer: BLUE CROSS/BLUE SHIELD | Admitting: Physician Assistant

## 2015-02-04 ENCOUNTER — Encounter: Payer: Self-pay | Admitting: Physician Assistant

## 2015-02-04 VITALS — BP 150/88 | HR 80 | Temp 98.7°F | Resp 16 | Ht 63.0 in | Wt 280.0 lb

## 2015-02-04 DIAGNOSIS — I10 Essential (primary) hypertension: Secondary | ICD-10-CM

## 2015-02-04 DIAGNOSIS — R739 Hyperglycemia, unspecified: Secondary | ICD-10-CM | POA: Diagnosis not present

## 2015-02-04 DIAGNOSIS — E669 Obesity, unspecified: Secondary | ICD-10-CM

## 2015-02-04 LAB — BASIC METABOLIC PANEL WITH GFR
BUN: 15 mg/dL (ref 7–25)
CO2: 27 mmol/L (ref 20–31)
Calcium: 9.6 mg/dL (ref 8.6–10.4)
Chloride: 100 mmol/L (ref 98–110)
Creat: 0.6 mg/dL (ref 0.50–1.05)
GFR, Est African American: >89 mL/min (ref >=60)
GFR, Est Non African American: >89 mL/min (ref >=60)
Glucose, Bld: 108 mg/dL — ABNORMAL HIGH (ref 70–99)
Potassium: 4.5 mmol/L (ref 3.5–5.3)
Sodium: 136 mmol/L (ref 135–146)

## 2015-02-04 MED ORDER — VALSARTAN 320 MG PO TABS: 320.0000 mg | ORAL_TABLET | Freq: Every day | ORAL | Status: DC

## 2015-02-04 MED ORDER — AMLODIPINE BESYLATE 5 MG PO TABS: 5.0000 mg | ORAL_TABLET | Freq: Every day | ORAL | Status: DC
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
## 2015-02-05 ENCOUNTER — Encounter: Payer: Self-pay | Admitting: *Deleted

## 2015-02-25 ENCOUNTER — Encounter: Payer: Self-pay | Admitting: Physician Assistant

## 2015-02-25 ENCOUNTER — Ambulatory Visit (INDEPENDENT_AMBULATORY_CARE_PROVIDER_SITE_OTHER): Payer: BLUE CROSS/BLUE SHIELD | Admitting: Physician Assistant

## 2015-02-25 VITALS — BP 126/78 | HR 72 | Temp 98.2°F | Resp 18 | Ht 62.5 in | Wt 282.0 lb

## 2015-02-25 DIAGNOSIS — Z0000 Encounter for general adult medical examination without abnormal findings: Secondary | ICD-10-CM

## 2015-02-25 LAB — HM PAP SMEAR: HM Pap smear: NORMAL

## 2015-02-25 NOTE — Progress Notes (Signed)
Patient ID: Lori Swanson MRN: [id number], DOB: 1955/10/27, 60 y.o. Date of Encounter: 02/25/2015,   Chief Complaint: Physical (CPE)  HPI: 60 y.o. y/o white female  here for CPE.   Reviewed her last routine visit with me which was 02/04/2015. That visit her blood pressure was elevated and we added Norvasc 5 mg. She states that she is taking this daily and is having no adverse effects. Blood pressure is at goal today.  She has no complaints or concerns today. Is here for complete physical exam to update preventive care.   Review of Systems: Consitutional: No fever, chills, fatigue, night sweats, lymphadenopathy. No significant/unexplained weight changes. Eyes: No visual changes, eye redness, or discharge. ENT/Mouth: No ear pain, sore throat, nasal drainage, or sinus pain. Cardiovascular: No chest pressure,heaviness, tightness or squeezing, even with exertion. No increased shortness of breath or dyspnea on exertion.No palpitations, edema, orthopnea, PND. Respiratory: No cough, hemoptysis, SOB, or wheezing. Gastrointestinal: No anorexia, dysphagia, reflux, pain, nausea, vomiting, hematemesis, diarrhea, constipation, BRBPR, or melena. Breast: No mass, nodules, bulging, or retraction. No skin changes or inflammation. No nipple discharge. No lymphadenopathy. Genitourinary: No dysuria, hematuria, incontinence, vaginal discharge, pruritis, burning, abnormal bleeding, or pain. Musculoskeletal: No decreased ROM, No joint pain or swelling. No significant pain in neck, back, or extremities. Skin: No rash, pruritis, or concerning lesions. Neurological: No headache, dizziness, syncope, seizures, tremors, memory loss, coordination problems, or paresthesias. Psychological: No anxiety, depression, hallucinations, SI/HI. Endocrine: No polydipsia, polyphagia, polyuria, or known diabetes.No increased fatigue. No palpitations/rapid heart rate. No significant/unexplained weight change. All other systems  were reviewed and are otherwise negative.  Past Medical History  Diagnosis Date  . Hyperlipidemia   . Hypertension   . Obesity   . Hyperglycemia   . Detached retina 06/23/2008  . Colon polyps   . Acute renal failure (HCC) 08/04/2014  . Vancomycin-induced nephrotoxicity 08/04/2014  . Right foot pain 09/09/2014     Past Surgical History  Procedure Laterality Date  . Breast biopsy Left     benign  . Eye surgery Left     cataract  . Tonsillectomy      Home Meds:  Outpatient Prescriptions Prior to Visit  Medication Sig Dispense Refill  . amLODipine (NORVASC) 5 MG tablet Take 1 tablet (5 mg total) by mouth daily. 30 tablet 3  . furosemide (LASIX) 20 MG tablet TAKE 1 TABLET (20 MG TOTAL) BY MOUTH DAILY. 90 tablet 1  . PROAIR HFA 108 (90 Base) MCG/ACT inhaler     . valsartan (DIOVAN) 320 MG tablet Take 1 tablet (320 mg total) by mouth daily. 90 tablet 1   No facility-administered medications prior to visit.    Allergies:  Allergies  Allergen Reactions  . Iodine Swelling  . Shellfish-Derived Products Swelling and Hives    Social History   Social History  . Marital Status: Single    Spouse Name: N/A  . Number of Children: N/A  . Years of Education: N/A   Occupational History  . Not on file.   Social History Main Topics  . Smoking status: Never Smoker   . Smokeless tobacco: Never Used  . Alcohol Use: No  . Drug Use: No  . Sexual Activity: Not on file   Other Topics Concern  . Not on file   Social History Narrative    Family History  Problem Relation Age of Onset  . Diabetes Mother   . Heart disease Mother     MI, CAD  . Cancer  Mother     breast  . Hypertension Mother   . Cancer Maternal Uncle     throat  . Cancer Maternal Uncle     bone  . Cancer Father     lung  . Hypertension Father   . Heart disease Father     pacemaker, MI in 22's    Physical Exam: Blood pressure 126/78, pulse 72, temperature 98.2 F (36.8 C), temperature source Oral, resp.  rate 18, height 5' 2.5" (1.588 m), weight 282 lb (127.914 kg)., Body mass index is 50.72 kg/(m^2). General: [name]. Appears in no acute distress. HEENT: Normocephalic, atraumatic. Conjunctiva pink, sclera non-icteric. Pupils 2 mm constricting to 1 mm, round, regular, and equally reactive to light and accomodation. EOMI. Internal auditory canal clear. TMs with good cone of light and without pathology. Nasal mucosa pink. Nares are without discharge. No sinus tenderness. Oral mucosa pink.  Pharynx without exudate.   Neck: Supple. Trachea midline. No thyromegaly. Full ROM. No lymphadenopathy.No Carotid Bruits. Lungs: Clear to auscultation bilaterally without wheezes, rales, or rhonchi. Breathing is of normal effort and unlabored. Cardiovascular: RRR with S1 S2. No murmurs, rubs, or gallops. Distal pulses 2+ symmetrically. No carotid or abdominal bruits. Breast: Symmetrical. No masses. Nipples without discharge. Abdomen: Soft, non-tender, non-distended with normoactive bowel sounds. No hepatosplenomegaly or masses. No rebound/guarding. No CVA tenderness. No hernias.  Genitourinary:  External genitalia without lesions. Vaginal mucosa pink.No discharge present. Cervix pink and without discharge. No cervical tenderness.Normal uterus size. No adnexal mass or tenderness.  Pap smear taken. Musculoskeletal: Full range of motion and 5/5 strength throughout.  Skin: Warm and moist. No rash, no suspicious lesions.  Neuro: A+Ox3. CN II-XII grossly intact. Moves all extremities spontaneously. Full sensation throughout. Normal gait. DTR 2+ throughout upper and lower extremities.  Psych:  Responds to questions appropriately with a normal affect.   Assessment/Plan:  60 y.o. y/o female here for CPE  1. Visit for preventive health examination  - MM Digital Screening; Future - Ambulatory referral to Gastroenterology - PAP, Thin Prep w/HPV rflx HPV Type 16/18   A. Screening Labs: Views with patient that 01/21/15 she  had CME T, FLP, A1c. She has had CBC in the past year. Reviewed with her that she has not had TSH or vitamin D level recently. She states that she is having no symptoms to suggest that she is having any abnormalities with these and prefers to wait and check these at her next lab draw.  B. Pap: She has had no hysterectomy. Pap smear 2013. Due to repeat today.  C. Screening Mammogram: Last mammogram was 03/06/12. She is agreeable for me to schedule follow-up mammogram as this is over due.  D. DEXA/BMD:  Wait until closer to age 32 for bone density scan. She has had no hysterectomy.  E. Colorectal Cancer Screening: Last colonoscopy was 2010 and showed polyps. This was performed by Dr. Randa Evens at [facility]. I have placed referral back to him for follow-up colonoscopy. Patient agreeable.  F. Immunizations:  Influenza:  She had influenza vaccine 08/2014 at infectious disease Tetanus:   01/16/2006-----repeat not due until next year Pneumococcal:   She has no indication to require a pneumonia vaccine until age 58. Zostavax:    Zostavax not indicated until age 89. Will discuss at her next visit at age 36.  Signed, [address] [place], [place], [facility] 02/25/2015 9:00 AM

## 2015-02-26 ENCOUNTER — Other Ambulatory Visit: Payer: Self-pay | Admitting: Physician Assistant

## 2015-02-26 DIAGNOSIS — Z1231 Encounter for screening mammogram for malignant neoplasm of breast: Secondary | ICD-10-CM

## 2015-03-01 LAB — PAP, THIN PREP W/HPV RFLX HPV TYPE 16/18: HPV DNA High Risk: NOT DETECTED

## 2015-03-03 ENCOUNTER — Encounter: Payer: Self-pay | Admitting: Family Medicine

## 2015-03-16 ENCOUNTER — Ambulatory Visit
Admission: RE | Admit: 2015-03-16 | Discharge: 2015-03-16 | Disposition: A | Discharge status: Home / Self Care | Payer: BLUE CROSS/BLUE SHIELD | Source: Ambulatory Visit | Attending: Physician Assistant | Admitting: Physician Assistant

## 2015-03-16 DIAGNOSIS — Z1231 Encounter for screening mammogram for malignant neoplasm of breast: Secondary | ICD-10-CM

## 2015-05-31 ENCOUNTER — Other Ambulatory Visit: Payer: Self-pay | Admitting: Physician Assistant

## 2015-05-31 NOTE — Telephone Encounter (Signed)
Refill appropriate and filled per protocol. 

## 2015-07-22 ENCOUNTER — Other Ambulatory Visit: Payer: Self-pay | Admitting: Physician Assistant

## 2015-07-22 NOTE — Telephone Encounter (Signed)
Refill appropriate and filled per protocol. 

## 2015-08-24 ENCOUNTER — Other Ambulatory Visit: Payer: Self-pay | Admitting: Physician Assistant

## 2015-08-24 DIAGNOSIS — I10 Essential (primary) hypertension: Secondary | ICD-10-CM

## 2015-08-24 NOTE — Telephone Encounter (Signed)
Medication refilled per protocol.  Letter to pt due for 6 mth visit

## 2015-09-22 ENCOUNTER — Other Ambulatory Visit: Payer: Self-pay | Admitting: Physician Assistant

## 2016-01-21 ENCOUNTER — Other Ambulatory Visit: Payer: Self-pay | Admitting: Physician Assistant

## 2016-02-21 ENCOUNTER — Other Ambulatory Visit: Payer: Self-pay | Admitting: Physician Assistant

## 2016-02-21 DIAGNOSIS — I10 Essential (primary) hypertension: Secondary | ICD-10-CM

## 2016-02-21 NOTE — Telephone Encounter (Signed)
Rx filled pt need office visit. Letter sent via my chart as well as mailed

## 2016-02-26 IMAGING — MR MR FOOT*R* WO/W CM
5 of 9 series · 19 of 40 positions shown · IV contrast (multihance)
Comparison: None.

CLINICAL DATA: Pain and swelling for 3 months. No known injury or
prior surgery.

EXAM:
MRI OF THE RIGHT FOREFOOT WITHOUT AND WITH CONTRAST
TECHNIQUE: Multiplanar, multisequence MR imaging was performed both before and
after administration of intravenous contrast.
CONTRAST:  20mL MULTIHANCE GADOBENATE DIMEGLUMINE 529 MG/ML IV SOLN

[Series 4: T2 fat-sat · coronal · 5.0mm · 0.23mm/px · 5 of 42 slices shown (1 of 3)]
[im 1/42]
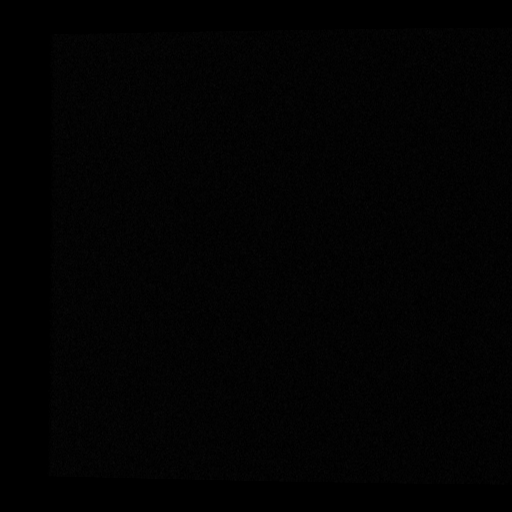
[im 11/42]
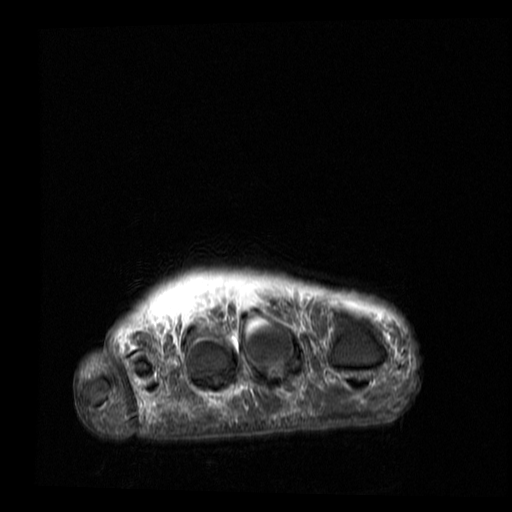
[im 21/42]
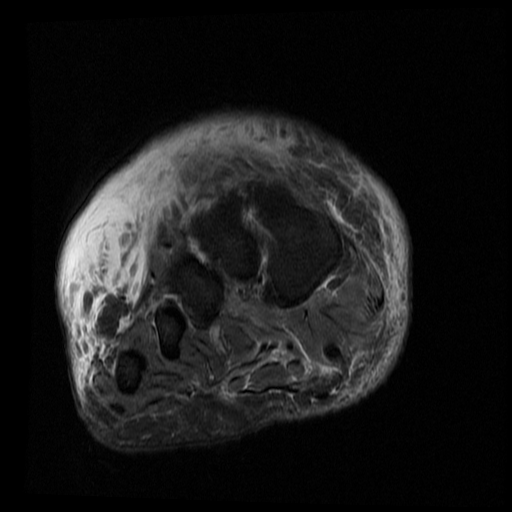
[im 31/42]
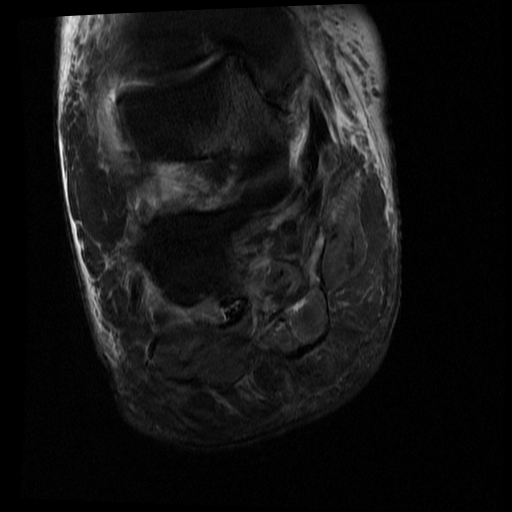
[im 42/42]
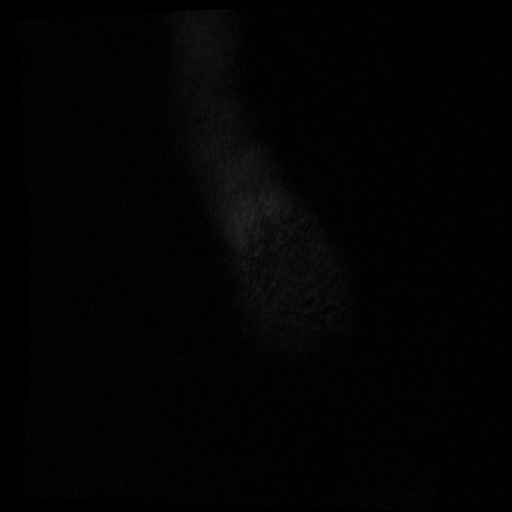

[Series 6: T1 · coronal · 5.0mm · 0.23mm/px · 2 of 40 slices shown]
[im 1/40]
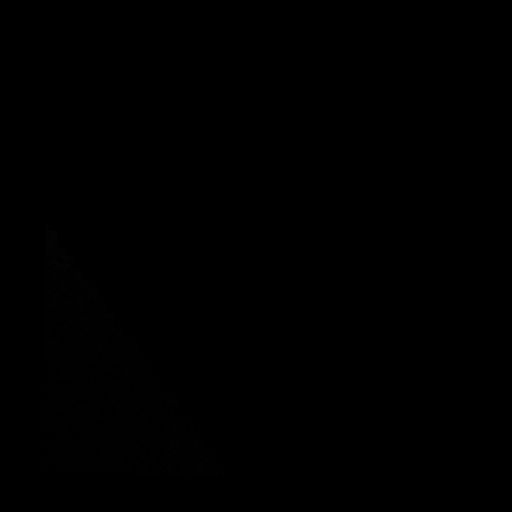
[im 10/40]
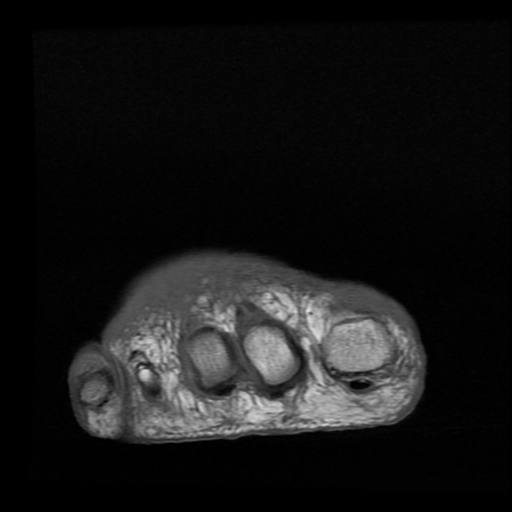

[Series 8: PD fat-sat · coronal · 5.0mm · 0.23mm/px · 6 of 40 slices shown]
[im 1/40]
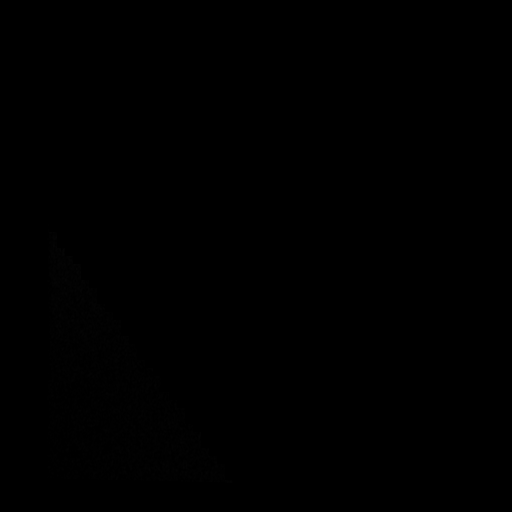
[im 8/40]
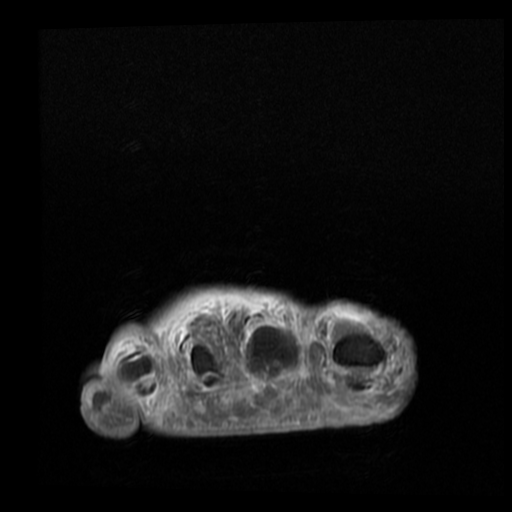
[im 16/40]
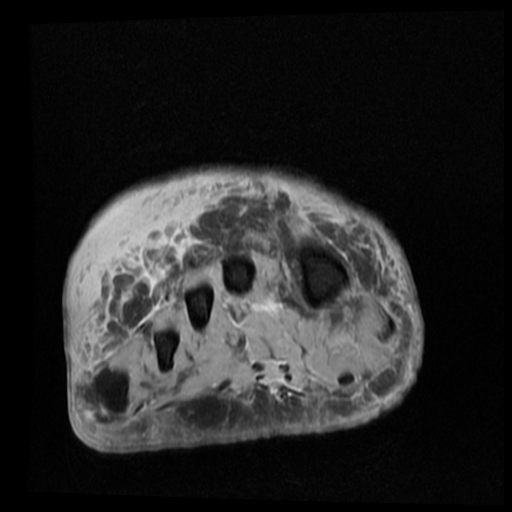
[im 24/40]
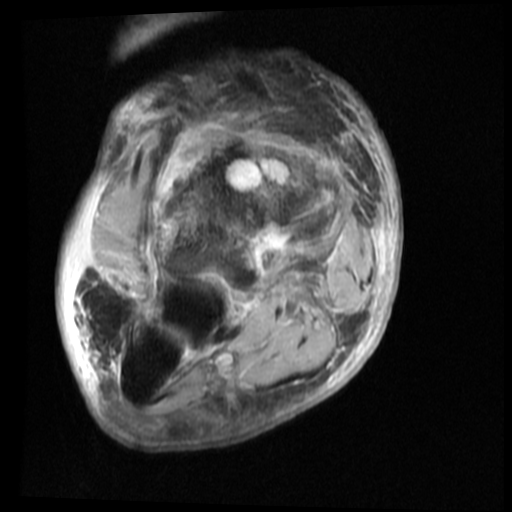
[im 32/40]
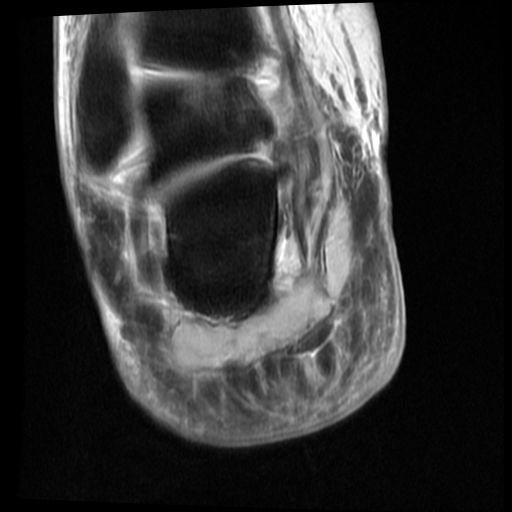
[im 40/40]
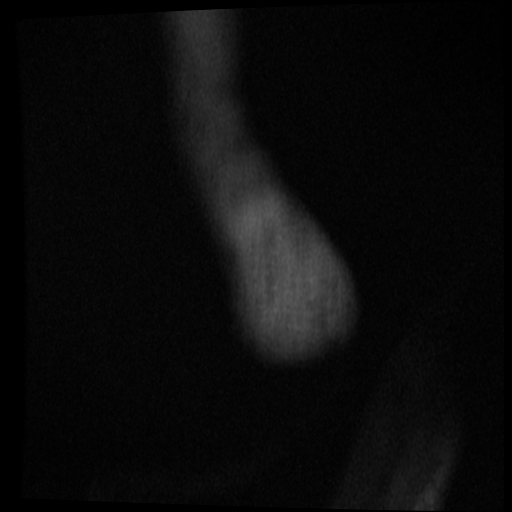

[Series 9: T2 fat-sat · axial · 4.0mm · 0.51mm/px · z∈[-140,-36]mm · 3 of 22 slices shown (2 of 3)]
[im 1/22]
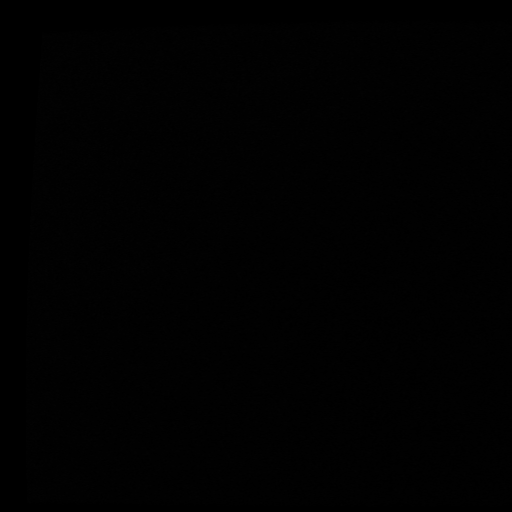
[im 11/22]
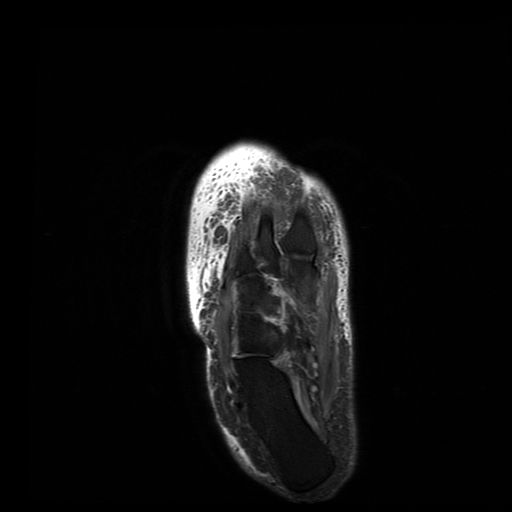
[im 22/22]
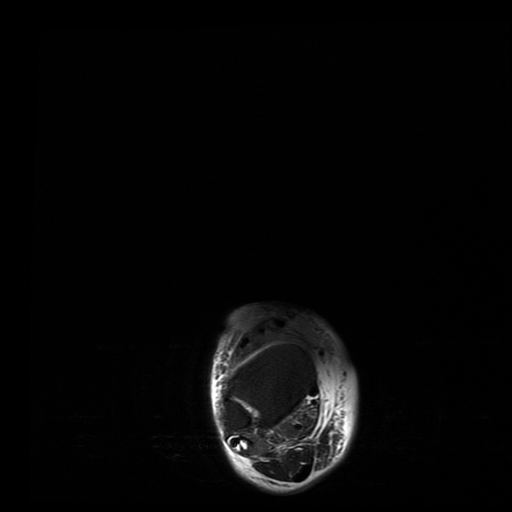

[Series 10: T2 fat-sat · sagittal · 4.0mm · 0.47mm/px · 3 of 20 slices shown (3 of 3)]
[im 1/20]
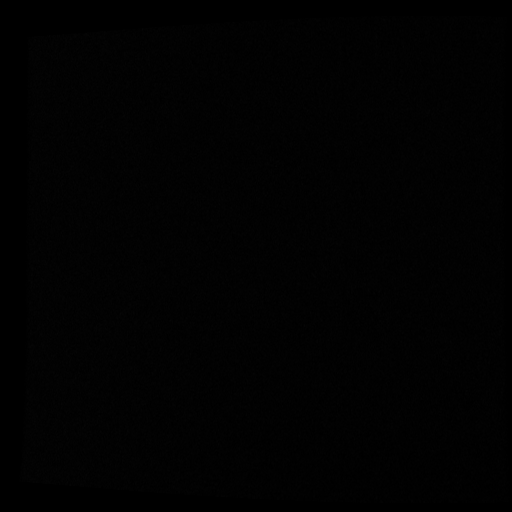
[im 10/20]
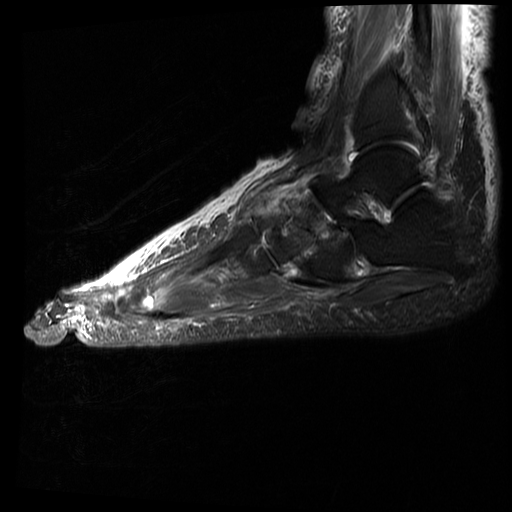
[im 20/20]
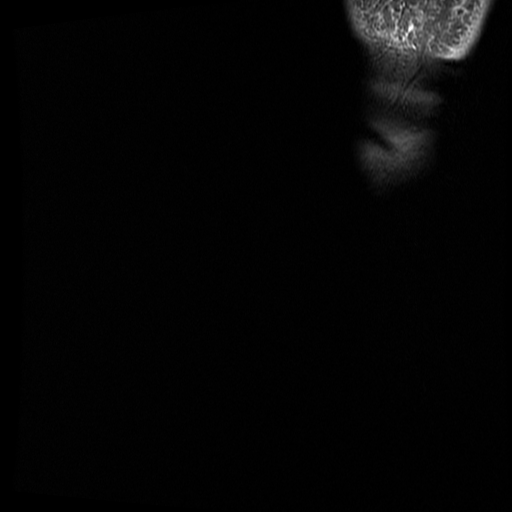

[19 of 40 positions shown; findings below may reference images not displayed]

FINDINGS: Significant midfoot degenerative changes with joint space narrowing,
osteophytic spurring, subchondral cystic change or erosions and
joint effusions. There is also significant enhancing synovitis.
Findings suspicious for inflammatory arthropathy. There also
moderate tibiotalar joint degenerative changes with medial joint
space narrowing and cartilage loss. Associated moderate stress edema
in the medial aspect of the talus. No significant ankle joint
effusion but there is moderate synovitis.

The major medial and lateral ankle ligaments and tendons are intact.
Mild peroneal tenosynovitis. The Achilles tendon and plantar fascia
are intact. A calcaneal heel spur is noted. Mild edema and
enhancement in the sinus tarsi E but the cervical and interosseous
ligaments are intact.

The foot musculature appears normal. There is diffuse subcutaneous
soft tissue swelling/ edema/ fluid suggesting cellulitis.
IMPRESSION: Significant midfoot arthropathy as discussed above.

Mild tibiotalar and subtalar inflammatory changes.

No acute bony findings.

Diffuse subcutaneous soft tissue swelling/ edema/ fluid suggesting
cellulitis without findings for myofasciitis.

## 2016-03-20 ENCOUNTER — Other Ambulatory Visit: Payer: Self-pay | Admitting: Physician Assistant

## 2016-03-20 DIAGNOSIS — I10 Essential (primary) hypertension: Secondary | ICD-10-CM

## 2016-03-20 NOTE — Telephone Encounter (Signed)
Refill appropriate letter mailed to schedule an appointment. Patient need office visit.

## 2016-04-19 ENCOUNTER — Other Ambulatory Visit: Payer: Self-pay | Admitting: Physician Assistant

## 2016-04-19 DIAGNOSIS — I10 Essential (primary) hypertension: Secondary | ICD-10-CM

## 2016-04-19 NOTE — Telephone Encounter (Signed)
Medication refill for one time only.  Patient needs to be seen.  Letter sent for patient to call and schedule.  Has been over 1 year.

## 2016-05-20 ENCOUNTER — Other Ambulatory Visit: Payer: Self-pay | Admitting: Physician Assistant

## 2016-05-20 DIAGNOSIS — I10 Essential (primary) hypertension: Secondary | ICD-10-CM

## 2016-05-21 ENCOUNTER — Other Ambulatory Visit: Payer: Self-pay | Admitting: Physician Assistant

## 2016-05-22 ENCOUNTER — Other Ambulatory Visit: Payer: Self-pay | Admitting: Physician Assistant

## 2016-05-22 DIAGNOSIS — I10 Essential (primary) hypertension: Secondary | ICD-10-CM

## 2016-05-22 NOTE — Telephone Encounter (Signed)
Patient need office visit before anymore refills

## 2016-05-22 NOTE — Telephone Encounter (Signed)
Patient need office visit before anymore refills  

## 2016-05-31 ENCOUNTER — Other Ambulatory Visit: Payer: Self-pay

## 2016-05-31 DIAGNOSIS — I10 Essential (primary) hypertension: Secondary | ICD-10-CM

## 2016-05-31 MED ORDER — VALSARTAN 320 MG PO TABS: 320.0000 mg | ORAL_TABLET | Freq: Every day | ORAL | 0 refills | Status: DC

## 2016-06-18 ENCOUNTER — Other Ambulatory Visit: Payer: Self-pay | Admitting: Physician Assistant

## 2016-06-19 NOTE — Telephone Encounter (Signed)
Rx filled per protocol. Pt is due for an office visit letter mailed

## 2016-07-02 ENCOUNTER — Other Ambulatory Visit: Payer: Self-pay | Admitting: Physician Assistant

## 2016-07-02 DIAGNOSIS — I10 Essential (primary) hypertension: Secondary | ICD-10-CM

## 2016-07-03 NOTE — Telephone Encounter (Signed)
Filled per protocol. Pt due for OV letter mailed as well as sent via my chart

## 2016-07-19 ENCOUNTER — Other Ambulatory Visit: Payer: Self-pay | Admitting: Physician Assistant

## 2016-08-02 ENCOUNTER — Other Ambulatory Visit: Payer: Self-pay | Admitting: Physician Assistant

## 2016-08-02 DIAGNOSIS — I10 Essential (primary) hypertension: Secondary | ICD-10-CM

## 2016-08-02 NOTE — Telephone Encounter (Signed)
Patient need office visit letter mailed  

## 2016-08-04 ENCOUNTER — Other Ambulatory Visit: Payer: Self-pay | Admitting: Physician Assistant

## 2016-08-30 ENCOUNTER — Other Ambulatory Visit: Payer: Self-pay | Admitting: Family Medicine

## 2016-08-30 DIAGNOSIS — Z79899 Other long term (current) drug therapy: Secondary | ICD-10-CM

## 2016-08-30 DIAGNOSIS — I10 Essential (primary) hypertension: Secondary | ICD-10-CM

## 2016-08-30 DIAGNOSIS — R739 Hyperglycemia, unspecified: Secondary | ICD-10-CM

## 2016-08-30 DIAGNOSIS — E785 Hyperlipidemia, unspecified: Secondary | ICD-10-CM

## 2016-08-31 ENCOUNTER — Other Ambulatory Visit: Payer: Self-pay | Admitting: Physician Assistant

## 2016-08-31 NOTE — Telephone Encounter (Signed)
Refill appropriate 

## 2016-09-01 ENCOUNTER — Other Ambulatory Visit: Payer: BLUE CROSS/BLUE SHIELD

## 2016-09-01 DIAGNOSIS — Z79899 Other long term (current) drug therapy: Secondary | ICD-10-CM

## 2016-09-01 DIAGNOSIS — I10 Essential (primary) hypertension: Secondary | ICD-10-CM

## 2016-09-01 DIAGNOSIS — R739 Hyperglycemia, unspecified: Secondary | ICD-10-CM

## 2016-09-01 DIAGNOSIS — E785 Hyperlipidemia, unspecified: Secondary | ICD-10-CM

## 2016-09-01 LAB — CBC WITH DIFFERENTIAL/PLATELET
Basophils Absolute: 49 cells/uL (ref 0–200)
Basophils Relative: 1 %
Eosinophils Absolute: 196 cells/uL (ref 15–500)
Eosinophils Relative: 4 %
HCT: 47.1 % — ABNORMAL HIGH (ref 35.0–45.0)
Hemoglobin: 15.5 g/dL — ABNORMAL HIGH (ref 12.0–15.0)
Lymphocytes Relative: 45 %
Lymphs Abs: 2205 cells/uL (ref 850–3900)
MCH: 30.3 pg (ref 27.0–33.0)
MCHC: 32.9 g/dL (ref 32.0–36.0)
MCV: 92 fL (ref 80.0–100.0)
MPV: 9.6 fL (ref 7.5–12.5)
Monocytes Absolute: 392 cells/uL (ref 200–950)
Monocytes Relative: 8 %
Neutro Abs: 2058 cells/uL (ref 1500–7800)
Neutrophils Relative %: 42 %
Platelets: 291 10*3/uL (ref 140–400)
RBC: 5.12 MIL/uL — ABNORMAL HIGH (ref 3.80–5.10)
RDW: 13.5 % (ref 11.0–15.0)
WBC: 4.9 10*3/uL (ref 3.8–10.8)

## 2016-09-02 LAB — COMPLETE METABOLIC PANEL WITH GFR
ALT: 13 U/L (ref 6–29)
AST: 13 U/L (ref 10–35)
Albumin: 4.3 g/dL (ref 3.6–5.1)
Alkaline Phosphatase: 70 U/L (ref 33–130)
BUN: 15 mg/dL (ref 7–25)
CO2: 24 mmol/L (ref 20–32)
Calcium: 9.5 mg/dL (ref 8.6–10.4)
Chloride: 104 mmol/L (ref 98–110)
Creat: 0.7 mg/dL (ref 0.50–0.99)
GFR, Est African American: >89 mL/min (ref >=60)
GFR, Est Non African American: >89 mL/min (ref >=60)
Glucose, Bld: 113 mg/dL — ABNORMAL HIGH (ref 70–99)
Potassium: 4.3 mmol/L (ref 3.5–5.3)
Sodium: 140 mmol/L (ref 135–146)
Total Bilirubin: 0.7 mg/dL (ref 0.2–1.2)
Total Protein: 6.4 g/dL (ref 6.1–8.1)

## 2016-09-02 LAB — LIPID PANEL
Cholesterol: 224 mg/dL — ABNORMAL HIGH (ref <200)
HDL: 37 mg/dL — ABNORMAL LOW (ref >50)
LDL Cholesterol: 146 mg/dL — ABNORMAL HIGH (ref <100)
Total CHOL/HDL Ratio: 6.1 Ratio — ABNORMAL HIGH (ref <5.0)
Triglycerides: 204 mg/dL — ABNORMAL HIGH (ref <150)
VLDL: 41 mg/dL — ABNORMAL HIGH (ref <30)

## 2016-09-02 LAB — HEMOGLOBIN A1C
Hgb A1c MFr Bld: 5.7 % — ABNORMAL HIGH (ref <5.7)
Mean Plasma Glucose: 117 mg/dL

## 2016-09-04 ENCOUNTER — Ambulatory Visit (INDEPENDENT_AMBULATORY_CARE_PROVIDER_SITE_OTHER): Payer: BLUE CROSS/BLUE SHIELD | Admitting: Physician Assistant

## 2016-09-04 ENCOUNTER — Encounter: Payer: Self-pay | Admitting: Physician Assistant

## 2016-09-04 VITALS — BP 130/80 | HR 88 | Temp 97.9°F | Resp 16 | Ht 63.0 in | Wt 286.6 lb

## 2016-09-04 DIAGNOSIS — R739 Hyperglycemia, unspecified: Secondary | ICD-10-CM

## 2016-09-04 DIAGNOSIS — I10 Essential (primary) hypertension: Secondary | ICD-10-CM

## 2016-09-04 DIAGNOSIS — E785 Hyperlipidemia, unspecified: Secondary | ICD-10-CM | POA: Diagnosis not present

## 2016-09-04 MED ORDER — LOSARTAN POTASSIUM 100 MG PO TABS: 100.0000 mg | ORAL_TABLET | Freq: Every day | ORAL | 0 refills | Status: DC
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
## 2016-09-20 ENCOUNTER — Ambulatory Visit (INDEPENDENT_AMBULATORY_CARE_PROVIDER_SITE_OTHER): Payer: BLUE CROSS/BLUE SHIELD | Admitting: Physician Assistant

## 2016-09-20 ENCOUNTER — Encounter: Payer: Self-pay | Admitting: Physician Assistant

## 2016-09-20 VITALS — BP 162/90 | HR 88 | Temp 98.0°F | Resp 18 | Ht 63.0 in | Wt 292.0 lb

## 2016-09-20 DIAGNOSIS — I10 Essential (primary) hypertension: Secondary | ICD-10-CM | POA: Diagnosis not present

## 2016-09-20 LAB — BASIC METABOLIC PANEL WITH GFR
BUN: 15 mg/dL (ref 7–25)
CO2: 26 mmol/L (ref 20–32)
Calcium: 9.1 mg/dL (ref 8.6–10.4)
Chloride: 103 mmol/L (ref 98–110)
Creat: 0.66 mg/dL (ref 0.50–0.99)
GFR, Est African American: 110 mL/min/{1.73_m2} (ref >=60)
GFR, Est Non African American: 95 mL/min/{1.73_m2} (ref >=60)
Glucose, Bld: 114 mg/dL — ABNORMAL HIGH (ref 65–99)
Potassium: 4.2 mmol/L (ref 3.5–5.3)
Sodium: 140 mmol/L (ref 135–146)
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
## 2016-09-21 ENCOUNTER — Encounter: Payer: Self-pay | Admitting: Family Medicine

## 2016-09-30 ENCOUNTER — Other Ambulatory Visit: Payer: Self-pay | Admitting: Physician Assistant

## 2016-10-02 NOTE — Telephone Encounter (Signed)
Refill appropriate 

## 2016-10-04 ENCOUNTER — Ambulatory Visit (INDEPENDENT_AMBULATORY_CARE_PROVIDER_SITE_OTHER): Payer: BLUE CROSS/BLUE SHIELD | Admitting: Physician Assistant

## 2016-10-04 ENCOUNTER — Encounter: Payer: Self-pay | Admitting: Physician Assistant

## 2016-10-04 VITALS — BP 140/100 | HR 66 | Temp 97.6°F | Resp 14 | Ht 63.0 in | Wt 293.6 lb

## 2016-10-04 DIAGNOSIS — I10 Essential (primary) hypertension: Secondary | ICD-10-CM

## 2016-10-04 MED ORDER — FUROSEMIDE 20 MG PO TABS: 20.0000 mg | ORAL_TABLET | Freq: Every day | ORAL | 1 refills | Status: DC

## 2016-10-04 MED ORDER — LOSARTAN POTASSIUM 100 MG PO TABS: 100.0000 mg | ORAL_TABLET | Freq: Every day | ORAL | 1 refills | Status: DC

## 2016-10-04 MED ORDER — NEBIVOLOL HCL 10 MG PO TABS: 10.0000 mg | ORAL_TABLET | Freq: Every day | ORAL | 1 refills | Status: DC
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
## 2016-10-10 ENCOUNTER — Telehealth: Payer: Self-pay

## 2016-10-10 NOTE — Telephone Encounter (Signed)
Bystolic is not covered by patient insurance. They require step one therapy medication first  Alternatives are as follows: Metoprolol succinate, propranolol, atenolol, carvediol  Pls advise

## 2016-10-11 MED ORDER — AMLODIPINE BESYLATE 5 MG PO TABS: ORAL_TABLET | ORAL | 0 refills | Status: DC

## 2016-10-11 NOTE — Telephone Encounter (Signed)
Norvasc  Take 1 daily for 1 week then if BP still > 135/85---then increase to taking 2 daily.  She has a nurse at her work who can check her BP. Tell pt this medication does not require any lab work to be monitored. Therefore she can just have nurse at work check BP some and then call here to report those readings. Rx for Norvasc 5 mg 1-2 daily as directed dispense #30+1 refill. Tell her that if she does end up needing to take 2 of these daily to get blood pressure at goal,  then I will change the Rx to Norvasc 10 mg 1 daily. She is to have nurse check BP at work and document these readings and then call me with readings. Remove Bystolic from that list.

## 2016-10-11 NOTE — Telephone Encounter (Signed)
Spoke with patient she is aware of the change as well as the instructions for taking the medication

## 2016-11-09 ENCOUNTER — Other Ambulatory Visit: Payer: Self-pay | Admitting: Physician Assistant

## 2016-11-14 ENCOUNTER — Other Ambulatory Visit: Payer: Self-pay | Admitting: Physician Assistant

## 2016-11-14 MED ORDER — AMLODIPINE BESYLATE 10 MG PO TABS: 10.0000 mg | ORAL_TABLET | Freq: Every day | ORAL | 3 refills | Status: DC

## 2016-11-14 NOTE — Telephone Encounter (Signed)
Patient called to inform that she is having to take 2 amlodipine pills daily. Will change rx from 5mg  1-2 daily to 10 mg daily

## 2017-04-02 ENCOUNTER — Other Ambulatory Visit: Payer: Self-pay | Admitting: Physician Assistant

## 2017-04-02 DIAGNOSIS — I10 Essential (primary) hypertension: Secondary | ICD-10-CM

## 2017-04-05 ENCOUNTER — Other Ambulatory Visit: Payer: Self-pay

## 2017-04-05 MED ORDER — AMLODIPINE BESYLATE 10 MG PO TABS: 10.0000 mg | ORAL_TABLET | Freq: Every day | ORAL | 0 refills | Status: DC

## 2017-04-05 NOTE — Telephone Encounter (Signed)
Patient is due for an office visit message sent via MyChart for patient to schedule the appointment.

## 2017-04-10 ENCOUNTER — Other Ambulatory Visit: Payer: Self-pay

## 2017-05-03 ENCOUNTER — Other Ambulatory Visit: Payer: Self-pay | Admitting: Physician Assistant

## 2017-09-26 ENCOUNTER — Other Ambulatory Visit: Payer: Self-pay | Admitting: Physician Assistant

## 2017-09-26 DIAGNOSIS — I10 Essential (primary) hypertension: Secondary | ICD-10-CM

## 2017-09-26 NOTE — Telephone Encounter (Signed)
I do not have access to the registry. Please ask the front office staff to assist with this.

## 2017-09-26 NOTE — Telephone Encounter (Signed)
Medication filled x1 with no refills.   Requires office visit before any further refills can be given.   Letter sent.  

## 2017-09-26 NOTE — Telephone Encounter (Signed)
Please disregard prior message

## 2017-10-22 ENCOUNTER — Other Ambulatory Visit: Payer: Self-pay | Admitting: Physician Assistant

## 2017-10-22 DIAGNOSIS — I10 Essential (primary) hypertension: Secondary | ICD-10-CM

## 2017-10-23 ENCOUNTER — Other Ambulatory Visit: Payer: Self-pay | Admitting: Physician Assistant

## 2017-11-18 ENCOUNTER — Other Ambulatory Visit: Payer: Self-pay | Admitting: Physician Assistant

## 2017-11-18 DIAGNOSIS — I10 Essential (primary) hypertension: Secondary | ICD-10-CM

## 2017-12-06 ENCOUNTER — Other Ambulatory Visit: Payer: Self-pay | Admitting: Family Medicine

## 2017-12-06 DIAGNOSIS — Z1231 Encounter for screening mammogram for malignant neoplasm of breast: Secondary | ICD-10-CM

## 2017-12-07 ENCOUNTER — Other Ambulatory Visit: Payer: Commercial Managed Care - PPO

## 2017-12-07 DIAGNOSIS — I10 Essential (primary) hypertension: Secondary | ICD-10-CM

## 2017-12-07 DIAGNOSIS — E785 Hyperlipidemia, unspecified: Secondary | ICD-10-CM

## 2017-12-07 DIAGNOSIS — Z79899 Other long term (current) drug therapy: Secondary | ICD-10-CM

## 2017-12-08 LAB — CBC WITH DIFFERENTIAL/PLATELET
Basophils Absolute: 61 cells/uL (ref 0–200)
Basophils Relative: 1.2 %
Eosinophils Absolute: 168 cells/uL (ref 15–500)
Eosinophils Relative: 3.3 %
HCT: 44.6 % (ref 35.0–45.0)
Hemoglobin: 14.7 g/dL (ref 11.7–15.5)
Lymphs Abs: 2234 cells/uL (ref 850–3900)
MCH: 29.7 pg (ref 27.0–33.0)
MCHC: 33 g/dL (ref 32.0–36.0)
MCV: 90.1 fL (ref 80.0–100.0)
MPV: 10 fL (ref 7.5–12.5)
Monocytes Relative: 8.8 %
Neutro Abs: 2188 cells/uL (ref 1500–7800)
Neutrophils Relative %: 42.9 %
Platelets: 369 10*3/uL (ref 140–400)
RBC: 4.95 10*6/uL (ref 3.80–5.10)
RDW: 12.4 % (ref 11.0–15.0)
Total Lymphocyte: 43.8 %
WBC mixed population: 449 cells/uL (ref 200–950)
WBC: 5.1 10*3/uL (ref 3.8–10.8)

## 2017-12-08 LAB — LIPID PANEL
Cholesterol: 188 mg/dL (ref <200)
HDL: 38 mg/dL — ABNORMAL LOW (ref >50)
LDL Cholesterol (Calc): 124 mg/dL (calc) — ABNORMAL HIGH
Non-HDL Cholesterol (Calc): 150 mg/dL (calc) — ABNORMAL HIGH (ref <130)
Total CHOL/HDL Ratio: 4.9 (calc) (ref <5.0)
Triglycerides: 144 mg/dL (ref <150)

## 2017-12-08 LAB — COMPREHENSIVE METABOLIC PANEL
AG Ratio: 1.9 (calc) (ref 1.0–2.5)
ALT: 12 U/L (ref 6–29)
AST: 13 U/L (ref 10–35)
Albumin: 4.2 g/dL (ref 3.6–5.1)
Alkaline phosphatase (APISO): 66 U/L (ref 33–130)
BUN: 19 mg/dL (ref 7–25)
CO2: 27 mmol/L (ref 20–32)
Calcium: 9.3 mg/dL (ref 8.6–10.4)
Chloride: 105 mmol/L (ref 98–110)
Creat: 0.72 mg/dL (ref 0.50–0.99)
Globulin: 2.2 g/dL (calc) (ref 1.9–3.7)
Glucose, Bld: 110 mg/dL — ABNORMAL HIGH (ref 65–99)
Potassium: 4.7 mmol/L (ref 3.5–5.3)
Sodium: 140 mmol/L (ref 135–146)
Total Bilirubin: 0.5 mg/dL (ref 0.2–1.2)
Total Protein: 6.4 g/dL (ref 6.1–8.1)

## 2017-12-08 LAB — HEMOGLOBIN A1C
Hgb A1c MFr Bld: 5.9 % of total Hgb — ABNORMAL HIGH (ref <5.7)
Mean Plasma Glucose: 123 (calc)
eAG (mmol/L): 6.8 (calc)

## 2017-12-10 ENCOUNTER — Other Ambulatory Visit: Payer: Self-pay | Admitting: Family Medicine

## 2017-12-10 DIAGNOSIS — I10 Essential (primary) hypertension: Secondary | ICD-10-CM

## 2017-12-10 MED ORDER — LOSARTAN POTASSIUM 100 MG PO TABS: 100.0000 mg | ORAL_TABLET | Freq: Every day | ORAL | 0 refills | Status: DC

## 2017-12-18 ENCOUNTER — Ambulatory Visit
Admission: RE | Admit: 2017-12-18 | Discharge: 2017-12-18 | Disposition: A | Discharge status: Home / Self Care | Payer: BLUE CROSS/BLUE SHIELD | Source: Ambulatory Visit | Attending: Family Medicine | Admitting: Family Medicine

## 2017-12-18 DIAGNOSIS — Z1231 Encounter for screening mammogram for malignant neoplasm of breast: Secondary | ICD-10-CM

## 2017-12-20 ENCOUNTER — Encounter: Payer: Self-pay | Admitting: Family Medicine

## 2017-12-20 ENCOUNTER — Ambulatory Visit (INDEPENDENT_AMBULATORY_CARE_PROVIDER_SITE_OTHER): Payer: Commercial Managed Care - PPO | Admitting: Family Medicine

## 2017-12-20 VITALS — BP 140/80 | HR 83 | Temp 98.4°F | Resp 18 | Ht 63.0 in | Wt 293.0 lb

## 2017-12-20 DIAGNOSIS — R739 Hyperglycemia, unspecified: Secondary | ICD-10-CM | POA: Diagnosis not present

## 2017-12-20 DIAGNOSIS — E785 Hyperlipidemia, unspecified: Secondary | ICD-10-CM

## 2017-12-20 DIAGNOSIS — I10 Essential (primary) hypertension: Secondary | ICD-10-CM

## 2017-12-20 NOTE — Progress Notes (Signed)
Subjective:    Patient ID: Lori Swanson, female    DOB: [birth year], 62 y.o.   MRN: [id number]  HPI  Patient has previously seen my partner.  She is a 62 year old white female with a history of hypertension, hyperlipidemia, and prediabetes.  She is currently on losartan and amlodipine.  She takes Lasix for leg swelling.  She has +1 pitting edema in both legs with chronic venous stasis changes on both shins.  She states that the swelling has worsened since she started amlodipine.  Her flu shot is up-to-date.  She denies any chest pain shortness of breath or dyspnea on exertion. Past Medical History:  Diagnosis Date  . Acute renal failure (HCC) 08/04/2014  . Colon polyps   . Detached retina 06/23/2008  . Hyperglycemia   . Hyperlipidemia   . Hypertension   . Obesity   . Right foot pain 09/09/2014  . Vancomycin-induced nephrotoxicity 08/04/2014   Past Surgical History:  Procedure Laterality Date  . BREAST BIOPSY Left    benign  . EYE SURGERY Left    cataract  . TONSILLECTOMY     Current Outpatient Medications on File Prior to Visit  Medication Sig Dispense Refill  . amLODipine (NORVASC) 10 MG tablet Take 1 tablet (10 mg total) by mouth daily. 90 tablet 0  . aspirin 81 MG tablet Take 81 mg by mouth daily.    . furosemide (LASIX) 20 MG tablet TAKE 1 TABLET BY MOUTH EVERY DAY 90 tablet 1  . losartan (COZAAR) 100 MG tablet Take 1 tablet (100 mg total) by mouth daily. 90 tablet 0   No current facility-administered medications on file prior to visit.    Allergies  Allergen Reactions  . Iodine Swelling  . Shellfish-Derived Products Swelling and Hives   Social History   Socioeconomic History  . Marital status: Single    Spouse name: Not on file  . Number of children: Not on file  . Years of education: Not on file  . Highest education level: Not on file  Occupational History  . Not on file  Social Needs  . Financial resource strain: Not on file  . Food insecurity:    Worry:  Not on file    Inability: Not on file  . Transportation needs:    Medical: Not on file    Non-medical: Not on file  Tobacco Use  . Smoking status: Never Smoker  . Smokeless tobacco: Never Used  Substance and Sexual Activity  . Alcohol use: No  . Drug use: No  . Sexual activity: Not on file  Lifestyle  . Physical activity:    Days per week: Not on file    Minutes per session: Not on file  . Stress: Not on file  Relationships  . Social connections:    Talks on phone: Not on file    Gets together: Not on file    Attends religious service: Not on file    Active member of club or organization: Not on file    Attends meetings of clubs or organizations: Not on file    Relationship status: Not on file  . Intimate partner violence:    Fear of current or ex partner: Not on file    Emotionally abused: Not on file    Physically abused: Not on file    Forced sexual activity: Not on file  Other Topics Concern  . Not on file  Social History Narrative  . Not on file  Review of Systems  All other systems reviewed and are negative.      Objective:   Physical Exam  Constitutional: She appears well-developed and well-nourished. No distress.  Cardiovascular: Normal rate, regular rhythm and normal heart sounds. Exam reveals no gallop and no friction rub.  No murmur heard. Pulmonary/Chest: Effort normal and breath sounds normal. No stridor. No respiratory distress. She has no wheezes. She has no rales.  Abdominal: Soft. Bowel sounds are normal. She exhibits no distension. There is no tenderness. There is no guarding.  Musculoskeletal: She exhibits edema.  Skin: Rash noted. She is not diaphoretic.  Vitals reviewed.         Assessment & Plan:  Essential hypertension  Hyperlipidemia, unspecified hyperlipidemia type  Hyperglycemia  Discontinue amlodipine due to leg swelling.  Discontinue Lasix.  Replace with hydrochlorothiazide 25 mg a day and recheck blood pressure in 1  month.  I reviewed the patient's most recent labs.  Given her hyperglycemia I recommended a low carbohydrate diet.  I recommended therapeutic lifestyle changes including increasing exercise to 30 minutes a day 5 days a week and I recommended 20 to 30 pounds weight loss.  Reassess in 6 months. Lab on 12/07/2017  Component Date Value Ref Range Status  . Glucose, Bld 12/07/2017 110* 65 - 99 mg/dL Final   Comment: .            Fasting reference interval . For someone without known diabetes, a glucose value between 100 and 125 mg/dL is consistent with prediabetes and should be confirmed with a follow-up test. .   . BUN 12/07/2017 19  7 - 25 mg/dL Final  . Creat 16/10/9602 0.72  0.50 - 0.99 mg/dL Final   Comment: For patients >36 years of age, the reference limit for Creatinine is approximately 13% higher for people identified as African-American. .   Edwena Felty Ratio 12/07/2017 NOT APPLICABLE  6 - 22 (calc) Final  . Sodium 12/07/2017 140  135 - 146 mmol/L Final  . Potassium 12/07/2017 4.7  3.5 - 5.3 mmol/L Final  . Chloride 12/07/2017 105  98 - 110 mmol/L Final  . CO2 12/07/2017 27  20 - 32 mmol/L Final  . Calcium 12/07/2017 9.3  8.6 - 10.4 mg/dL Final  . Total Protein 12/07/2017 6.4  6.1 - 8.1 g/dL Final  . Albumin 54/09/8117 4.2  3.6 - 5.1 g/dL Final  . Globulin 14/78/2956 2.2  1.9 - 3.7 g/dL (calc) Final  . AG Ratio 12/07/2017 1.9  1.0 - 2.5 (calc) Final  . Total Bilirubin 12/07/2017 0.5  0.2 - 1.2 mg/dL Final  . Alkaline phosphatase (APISO) 12/07/2017 66  33 - 130 U/L Final  . AST 12/07/2017 13  10 - 35 U/L Final  . ALT 12/07/2017 12  6 - 29 U/L Final  . WBC 12/07/2017 5.1  3.8 - 10.8 Thousand/uL Final  . RBC 12/07/2017 4.95  3.80 - 5.10 Million/uL Final  . Hemoglobin 12/07/2017 14.7  11.7 - 15.5 g/dL Final  . HCT 21/30/8657 44.6  35.0 - 45.0 % Final  . MCV 12/07/2017 90.1  80.0 - 100.0 fL Final  . MCH 12/07/2017 29.7  27.0 - 33.0 pg Final  . MCHC 12/07/2017 33.0  32.0 -  36.0 g/dL Final  . RDW 84/69/6295 12.4  11.0 - 15.0 % Final  . Platelets 12/07/2017 369  140 - 400 Thousand/uL Final  . MPV 12/07/2017 10.0  7.5 - 12.5 fL Final  . Neutro Abs 12/07/2017 2,188  1,500 -  7,800 cells/uL Final  . Lymphs Abs 12/07/2017 2,234  850 - 3,900 cells/uL Final  . WBC mixed population 12/07/2017 449  200 - 950 cells/uL Final  . Eosinophils Absolute 12/07/2017 168  15 - 500 cells/uL Final  . Basophils Absolute 12/07/2017 61  0 - 200 cells/uL Final  . Neutrophils Relative % 12/07/2017 42.9  % Final  . Total Lymphocyte 12/07/2017 43.8  % Final  . Monocytes Relative 12/07/2017 8.8  % Final  . Eosinophils Relative 12/07/2017 3.3  % Final  . Basophils Relative 12/07/2017 1.2  % Final  . Hgb A1c MFr Bld 12/07/2017 5.9* <5.7 % of total Hgb Final   Comment: For someone without known diabetes, a hemoglobin  A1c value between 5.7% and 6.4% is consistent with prediabetes and should be confirmed with a  follow-up test. . For someone with known diabetes, a value <7% indicates that their diabetes is well controlled. A1c targets should be individualized based on duration of diabetes, age, comorbid conditions, and other considerations. . This assay result is consistent with an increased risk of diabetes. . Currently, no consensus exists regarding use of hemoglobin A1c for diagnosis of diabetes for children. .   . Mean Plasma Glucose 12/07/2017 123  (calc) Final  . eAG (mmol/L) 12/07/2017 6.8  (calc) Final  . Cholesterol 12/07/2017 188  <200 mg/dL Final  . HDL 16/10/9602 38* >50 mg/dL Final  . Triglycerides 12/07/2017 144  <150 mg/dL Final  . LDL Cholesterol (Calc) 12/07/2017 124* mg/dL (calc) Final   Comment: Reference range: <100 . Desirable range <100 mg/dL for primary prevention;   <70 mg/dL for patients with CHD or diabetic patients  with > or = 2 CHD risk factors. Marland Kitchen LDL-C is now calculated using the Martin-Hopkins  calculation, which is a validated novel method  providing  better accuracy than the Friedewald equation in the  estimation of LDL-C.  Horald Pollen et al. Lenox Ahr. 5409;811(91): 2061-2068  (http://education.[facility].com/faq/FAQ164)   . Total CHOL/HDL Ratio 12/07/2017 4.9  <4.7 (calc) Final  . Non-HDL Cholesterol (Calc) 12/07/2017 150* <130 mg/dL (calc) Final   Comment: For patients with diabetes plus 1 major ASCVD risk  factor, treating to a non-HDL-C goal of <100 mg/dL  (LDL-C of <82 mg/dL) is considered a therapeutic  option.

## 2017-12-25 ENCOUNTER — Other Ambulatory Visit: Payer: Self-pay | Admitting: *Deleted

## 2017-12-25 MED ORDER — AMLODIPINE BESYLATE 10 MG PO TABS: 10.0000 mg | ORAL_TABLET | Freq: Every day | ORAL | 3 refills | Status: DC

## 2018-03-03 ENCOUNTER — Other Ambulatory Visit: Payer: Self-pay | Admitting: Family Medicine

## 2018-03-03 DIAGNOSIS — I10 Essential (primary) hypertension: Secondary | ICD-10-CM

## 2018-05-02 DIAGNOSIS — M25561 Pain in right knee: Secondary | ICD-10-CM | POA: Diagnosis not present

## 2018-05-02 DIAGNOSIS — M1711 Unilateral primary osteoarthritis, right knee: Secondary | ICD-10-CM | POA: Diagnosis not present

## 2018-05-15 DIAGNOSIS — M25561 Pain in right knee: Secondary | ICD-10-CM | POA: Diagnosis not present

## 2018-05-17 ENCOUNTER — Other Ambulatory Visit: Payer: Self-pay | Admitting: Family Medicine

## 2018-05-17 DIAGNOSIS — M25561 Pain in right knee: Secondary | ICD-10-CM

## 2018-05-29 ENCOUNTER — Ambulatory Visit
Admission: RE | Admit: 2018-05-29 | Discharge: 2018-05-29 | Disposition: A | Discharge status: Home / Self Care | Payer: Commercial Managed Care - PPO | Source: Ambulatory Visit | Attending: Family Medicine | Admitting: Family Medicine

## 2018-05-29 ENCOUNTER — Other Ambulatory Visit: Payer: Self-pay

## 2018-05-29 DIAGNOSIS — M25561 Pain in right knee: Secondary | ICD-10-CM | POA: Diagnosis not present

## 2018-06-01 ENCOUNTER — Other Ambulatory Visit: Payer: Self-pay | Admitting: Family Medicine

## 2018-06-04 DIAGNOSIS — S83241A Other tear of medial meniscus, current injury, right knee, initial encounter: Secondary | ICD-10-CM | POA: Diagnosis not present

## 2018-06-04 DIAGNOSIS — M25561 Pain in right knee: Secondary | ICD-10-CM | POA: Diagnosis not present

## 2018-07-11 ENCOUNTER — Other Ambulatory Visit: Payer: Self-pay | Admitting: Family Medicine

## 2018-07-11 DIAGNOSIS — I10 Essential (primary) hypertension: Secondary | ICD-10-CM

## 2018-12-01 ENCOUNTER — Other Ambulatory Visit: Payer: Self-pay | Admitting: Family Medicine

## 2019-01-30 ENCOUNTER — Encounter: Payer: Self-pay | Admitting: Family Medicine

## 2019-01-30 ENCOUNTER — Other Ambulatory Visit: Payer: Self-pay

## 2019-01-30 ENCOUNTER — Ambulatory Visit (INDEPENDENT_AMBULATORY_CARE_PROVIDER_SITE_OTHER): Payer: Commercial Managed Care - PPO | Admitting: Family Medicine

## 2019-01-30 VITALS — BP 134/80 | HR 90 | Temp 97.8°F | Resp 16 | Ht 63.0 in | Wt 298.0 lb

## 2019-01-30 DIAGNOSIS — R739 Hyperglycemia, unspecified: Secondary | ICD-10-CM | POA: Diagnosis not present

## 2019-01-30 DIAGNOSIS — E785 Hyperlipidemia, unspecified: Secondary | ICD-10-CM | POA: Diagnosis not present

## 2019-01-30 DIAGNOSIS — I10 Essential (primary) hypertension: Secondary | ICD-10-CM | POA: Diagnosis not present

## 2019-01-30 NOTE — Progress Notes (Signed)
Subjective:    Patient ID: Lori Swanson, female    DOB: 1955/06/03, 64 y.o.   MRN: [id number]  HPI 12/2017 Patient has previously seen my partner.  She is a 3 year old white female with a history of hypertension, hyperlipidemia, and prediabetes.  She is currently on losartan and amlodipine.  She takes Lasix for leg swelling.  She has +1 pitting edema in both legs with chronic venous stasis changes on both shins.  She states that the swelling has worsened since she started amlodipine.  Her flu shot is up-to-date.  She denies any chest pain shortness of breath or dyspnea on exertion.  At that time, my plan was: Discontinue amlodipine due to leg swelling.  Discontinue Lasix.  Replace with hydrochlorothiazide 25 mg a day and recheck blood pressure in 1 month.  I reviewed the patient's most recent labs.  Given her hyperglycemia I recommended a low carbohydrate diet.  I recommended therapeutic lifestyle changes including increasing exercise to 30 minutes a day 5 days a week and I recommended 20 to 30 pounds weight loss.  Reassess in 6 months.  01/30/19 Mammogram was last in December 2019, patient appears to be due for a colonoscopy as well as a flu shot.  Last documented Pap smear was in 2017.  However after questioning the patient, she states that she had her flu shot at work.  She would like to schedule her mammogram herself.  She also wants to do her Pap smear this year along with her colonoscopy, however she would like to wait till later in the year.  She prefers to call us back and reschedule that her convenience.  Otherwise she is doing well.  She still has significant edema in her left lower extremity distal to the knee.  There are chronic venous stasis changes in the skin overlying the distal shin just above the ankle.  She has trace to +1 pitting edema in the left leg although the right leg is better.  Changes will be consistent with chronic venous insufficiency.  She is no longer taking  amlodipine or furosemide but she is taking hydrochlorothiazide.   Past Medical History:  Diagnosis Date  . Acute renal failure ([place]) 08/04/2014  . Colon polyps   . Detached retina 06/23/2008  . Hyperglycemia   . Hyperlipidemia   . Hypertension   . Obesity   . Right foot pain 09/09/2014  . Vancomycin-induced nephrotoxicity 08/04/2014   Past Surgical History:  Procedure Laterality Date  . BREAST BIOPSY Left    benign  . EYE SURGERY Left    cataract  . TONSILLECTOMY     Current Outpatient Medications on File Prior to Visit  Medication Sig Dispense Refill  . amLODipine (NORVASC) 10 MG tablet Take 1 tablet (10 mg total) by mouth daily. 90 tablet 3  . aspirin 81 MG tablet Take 81 mg by mouth daily.    . furosemide (LASIX) 20 MG tablet TAKE 1 TABLET BY MOUTH EVERY DAY 90 tablet 1  . hydrochlorothiazide (HYDRODIURIL) 25 MG tablet TAKE 1 TABLET BY MOUTH EVERY DAY 90 tablet 0  . losartan (COZAAR) 100 MG tablet TAKE 1 TABLET BY MOUTH EVERY DAY 90 tablet 1   No current facility-administered medications on file prior to visit.   Allergies  Allergen Reactions  . Iodine Swelling  . Shellfish-Derived Products Swelling and Hives   Social History   Socioeconomic History  . Marital status: Single    Spouse name: Not on file  . Number of  children: Not on file  . Years of education: Not on file  . Highest education level: Not on file  Occupational History  . Not on file  Tobacco Use  . Smoking status: Never Smoker  . Smokeless tobacco: Never Used  Substance and Sexual Activity  . Alcohol use: No  . Drug use: No  . Sexual activity: Not on file  Other Topics Concern  . Not on file  Social History Narrative  . Not on file   Social Determinants of Health   Financial Resource Strain:   . Difficulty of Paying Living Expenses: Not on file  Food Insecurity:   . Worried About Programme researcher, broadcasting/film/video in the Last Year: Not on file  . Ran Out of Food in the Last Year: Not on file    Transportation Needs:   . Lack of Transportation (Medical): Not on file  . Lack of Transportation (Non-Medical): Not on file  Physical Activity:   . Days of Exercise per Week: Not on file  . Minutes of Exercise per Session: Not on file  Stress:   . Feeling of Stress : Not on file  Social Connections:   . Frequency of Communication with Friends and Family: Not on file  . Frequency of Social Gatherings with Friends and Family: Not on file  . Attends Religious Services: Not on file  . Active Member of Clubs or Organizations: Not on file  . Attends Banker Meetings: Not on file  . Marital Status: Not on file  Intimate Partner Violence:   . Fear of Current or Ex-Partner: Not on file  . Emotionally Abused: Not on file  . Physically Abused: Not on file  . Sexually Abused: Not on file     Review of Systems  All other systems reviewed and are negative.      Objective:   Physical Exam Vitals reviewed.  Constitutional:      General: She is not in acute distress.    Appearance: She is well-developed. She is not diaphoretic.  Cardiovascular:     Rate and Rhythm: Normal rate and regular rhythm.     Heart sounds: Normal heart sounds. No murmur. No friction rub. No gallop.   Pulmonary:     Effort: Pulmonary effort is normal. No respiratory distress.     Breath sounds: Normal breath sounds. No stridor. No wheezing or rales.  Abdominal:     General: Bowel sounds are normal. There is no distension.     Palpations: Abdomen is soft.     Tenderness: There is no abdominal tenderness. There is no guarding.           Assessment & Plan:  Essential hypertension - Plan: Hemoglobin A1c, CBC with Differential, COMPLETE METABOLIC PANEL WITH GFR, Lipid Panel  Hyperlipidemia, unspecified hyperlipidemia type - Plan: Hemoglobin A1c, CBC with Differential, COMPLETE METABOLIC PANEL WITH GFR, Lipid Panel  Hyperglycemia - Plan: Hemoglobin A1c, CBC with Differential, COMPLETE METABOLIC  PANEL WITH GFR, Lipid Panel  Blood pressure today is excellent.  She has a history of mild elevated blood sugars and therefore I will check a hemoglobin A1c to monitor for the development of prediabetes.  We discussed treatment strategies chronic venous since deficiency I recommended knee-high compression hose.  At the present time, patient does not want to switch back to Lasix.  Immunizations are up-to-date.  Regular anticipatory guidance is provided.  She will schedule her own mammogram.  She will call us back when she is ready to  schedule her Pap smear along with her colonoscopy.  She prefers a female provider to perform the Pap smear.

## 2019-01-31 ENCOUNTER — Other Ambulatory Visit: Payer: Self-pay | Admitting: Family Medicine

## 2019-01-31 ENCOUNTER — Telehealth: Payer: Self-pay | Admitting: Family Medicine

## 2019-01-31 DIAGNOSIS — I10 Essential (primary) hypertension: Secondary | ICD-10-CM

## 2019-01-31 LAB — COMPLETE METABOLIC PANEL WITH GFR
AG Ratio: 2 (calc) (ref 1.0–2.5)
ALT: 12 U/L (ref 6–29)
AST: 13 U/L (ref 10–35)
Albumin: 4.3 g/dL (ref 3.6–5.1)
Alkaline phosphatase (APISO): 58 U/L (ref 37–153)
BUN: 16 mg/dL (ref 7–25)
CO2: 26 mmol/L (ref 20–32)
Calcium: 9.6 mg/dL (ref 8.6–10.4)
Chloride: 103 mmol/L (ref 98–110)
Creat: 0.73 mg/dL (ref 0.50–0.99)
GFR, Est African American: 102 mL/min/{1.73_m2} (ref >=60)
GFR, Est Non African American: 88 mL/min/{1.73_m2} (ref >=60)
Globulin: 2.2 g/dL (calc) (ref 1.9–3.7)
Glucose, Bld: 132 mg/dL — ABNORMAL HIGH (ref 65–99)
Potassium: 3.9 mmol/L (ref 3.5–5.3)
Sodium: 140 mmol/L (ref 135–146)
Total Bilirubin: 0.7 mg/dL (ref 0.2–1.2)
Total Protein: 6.5 g/dL (ref 6.1–8.1)

## 2019-01-31 LAB — CBC WITH DIFFERENTIAL/PLATELET
Absolute Monocytes: 456 cells/uL (ref 200–950)
Basophils Absolute: 52 cells/uL (ref 0–200)
Basophils Relative: 1.1 %
Eosinophils Absolute: 118 cells/uL (ref 15–500)
Eosinophils Relative: 2.5 %
HCT: 45.5 % — ABNORMAL HIGH (ref 35.0–45.0)
Hemoglobin: 15.3 g/dL (ref 11.7–15.5)
Lymphs Abs: 1683 cells/uL (ref 850–3900)
MCH: 30.9 pg (ref 27.0–33.0)
MCHC: 33.6 g/dL (ref 32.0–36.0)
MCV: 91.9 fL (ref 80.0–100.0)
MPV: 10.3 fL (ref 7.5–12.5)
Monocytes Relative: 9.7 %
Neutro Abs: 2392 cells/uL (ref 1500–7800)
Neutrophils Relative %: 50.9 %
Platelets: 327 10*3/uL (ref 140–400)
RBC: 4.95 10*6/uL (ref 3.80–5.10)
RDW: 12.8 % (ref 11.0–15.0)
Total Lymphocyte: 35.8 %
WBC: 4.7 10*3/uL (ref 3.8–10.8)

## 2019-01-31 LAB — HEMOGLOBIN A1C
Hgb A1c MFr Bld: 6 % of total Hgb — ABNORMAL HIGH (ref <5.7)
Mean Plasma Glucose: 126 (calc)
eAG (mmol/L): 7 (calc)

## 2019-01-31 LAB — LIPID PANEL
Cholesterol: 193 mg/dL (ref <200)
HDL: 41 mg/dL — ABNORMAL LOW (ref >=50)
LDL Cholesterol (Calc): 119 mg/dL (calc) — ABNORMAL HIGH
Non-HDL Cholesterol (Calc): 152 mg/dL (calc) — ABNORMAL HIGH (ref <130)
Total CHOL/HDL Ratio: 4.7 (calc) (ref <5.0)
Triglycerides: 213 mg/dL — ABNORMAL HIGH (ref <150)

## 2019-01-31 MED ORDER — ATORVASTATIN CALCIUM 10 MG PO TABS: 10.0000 mg | ORAL_TABLET | Freq: Every day | ORAL | 1 refills | Status: DC

## 2019-01-31 NOTE — Telephone Encounter (Signed)
Patient requesting a refill on her losartan she uses [facility] on [address]  CB# [phone]

## 2019-02-03 MED ORDER — LOSARTAN POTASSIUM 100 MG PO TABS: 100.0000 mg | ORAL_TABLET | Freq: Every day | ORAL | 1 refills | Status: DC

## 2019-02-03 NOTE — Telephone Encounter (Signed)
Medication called/sent to requested pharmacy  

## 2019-02-26 ENCOUNTER — Other Ambulatory Visit: Payer: Self-pay | Admitting: Family Medicine

## 2019-06-04 ENCOUNTER — Other Ambulatory Visit: Payer: Self-pay | Admitting: Family Medicine

## 2019-06-04 MED ORDER — HYDROCHLOROTHIAZIDE 25 MG PO TABS: 25.0000 mg | ORAL_TABLET | Freq: Every day | ORAL | 2 refills | Status: DC

## 2019-07-22 ENCOUNTER — Other Ambulatory Visit: Payer: Self-pay | Admitting: Family Medicine

## 2019-10-17 ENCOUNTER — Other Ambulatory Visit: Payer: Self-pay | Admitting: Family Medicine

## 2019-10-17 DIAGNOSIS — I10 Essential (primary) hypertension: Secondary | ICD-10-CM

## 2020-01-18 ENCOUNTER — Other Ambulatory Visit: Payer: Self-pay | Admitting: Family Medicine

## 2020-03-06 ENCOUNTER — Other Ambulatory Visit: Payer: Self-pay | Admitting: Family Medicine

## 2020-04-19 ENCOUNTER — Other Ambulatory Visit: Payer: Self-pay | Admitting: *Deleted

## 2020-04-19 DIAGNOSIS — I10 Essential (primary) hypertension: Secondary | ICD-10-CM

## 2020-04-19 MED ORDER — LOSARTAN POTASSIUM 100 MG PO TABS: 1.0000 | ORAL_TABLET | Freq: Every day | ORAL | 0 refills | Status: DC

## 2020-05-17 ENCOUNTER — Other Ambulatory Visit: Payer: Commercial Managed Care - PPO

## 2020-05-17 ENCOUNTER — Other Ambulatory Visit: Payer: Self-pay

## 2020-05-17 DIAGNOSIS — E785 Hyperlipidemia, unspecified: Secondary | ICD-10-CM

## 2020-05-17 DIAGNOSIS — I10 Essential (primary) hypertension: Secondary | ICD-10-CM

## 2020-05-17 DIAGNOSIS — R739 Hyperglycemia, unspecified: Secondary | ICD-10-CM

## 2020-05-18 ENCOUNTER — Encounter: Payer: Self-pay | Admitting: Family Medicine

## 2020-05-18 ENCOUNTER — Ambulatory Visit (INDEPENDENT_AMBULATORY_CARE_PROVIDER_SITE_OTHER): Payer: Commercial Managed Care - PPO | Admitting: Family Medicine

## 2020-05-18 VITALS — BP 134/82 | HR 94 | Temp 97.9°F | Resp 14 | Ht 63.0 in | Wt 290.0 lb

## 2020-05-18 DIAGNOSIS — Z780 Asymptomatic menopausal state: Secondary | ICD-10-CM

## 2020-05-18 DIAGNOSIS — E785 Hyperlipidemia, unspecified: Secondary | ICD-10-CM

## 2020-05-18 DIAGNOSIS — Z0001 Encounter for general adult medical examination with abnormal findings: Secondary | ICD-10-CM

## 2020-05-18 DIAGNOSIS — Z0000 Encounter for general adult medical examination without abnormal findings: Secondary | ICD-10-CM

## 2020-05-18 DIAGNOSIS — Z1211 Encounter for screening for malignant neoplasm of colon: Secondary | ICD-10-CM | POA: Diagnosis not present

## 2020-05-18 DIAGNOSIS — I10 Essential (primary) hypertension: Secondary | ICD-10-CM

## 2020-05-18 DIAGNOSIS — Z23 Encounter for immunization: Secondary | ICD-10-CM | POA: Diagnosis not present

## 2020-05-18 DIAGNOSIS — Z124 Encounter for screening for malignant neoplasm of cervix: Secondary | ICD-10-CM

## 2020-05-18 LAB — CBC WITH DIFFERENTIAL/PLATELET
Absolute Monocytes: 480 cells/uL (ref 200–950)
Basophils Absolute: 40 cells/uL (ref 0–200)
Basophils Relative: 0.8 %
Eosinophils Absolute: 130 cells/uL (ref 15–500)
Eosinophils Relative: 2.6 %
HCT: 47.5 % — ABNORMAL HIGH (ref 35.0–45.0)
Hemoglobin: 15.2 g/dL (ref 11.7–15.5)
Lymphs Abs: 1980 cells/uL (ref 850–3900)
MCH: 29.8 pg (ref 27.0–33.0)
MCHC: 32 g/dL (ref 32.0–36.0)
MCV: 93.1 fL (ref 80.0–100.0)
MPV: 10.3 fL (ref 7.5–12.5)
Monocytes Relative: 9.6 %
Neutro Abs: 2370 cells/uL (ref 1500–7800)
Neutrophils Relative %: 47.4 %
Platelets: 317 10*3/uL (ref 140–400)
RBC: 5.1 10*6/uL (ref 3.80–5.10)
RDW: 12.5 % (ref 11.0–15.0)
Total Lymphocyte: 39.6 %
WBC: 5 10*3/uL (ref 3.8–10.8)

## 2020-05-18 LAB — COMPLETE METABOLIC PANEL WITH GFR
AG Ratio: 1.9 (calc) (ref 1.0–2.5)
ALT: 12 U/L (ref 6–29)
AST: 14 U/L (ref 10–35)
Albumin: 4.3 g/dL (ref 3.6–5.1)
Alkaline phosphatase (APISO): 56 U/L (ref 37–153)
BUN: 17 mg/dL (ref 7–25)
CO2: 28 mmol/L (ref 20–32)
Calcium: 9.6 mg/dL (ref 8.6–10.4)
Chloride: 101 mmol/L (ref 98–110)
Creat: 0.77 mg/dL (ref 0.50–0.99)
GFR, Est African American: 94 mL/min/{1.73_m2} (ref >=60)
GFR, Est Non African American: 81 mL/min/{1.73_m2} (ref >=60)
Globulin: 2.3 g/dL (calc) (ref 1.9–3.7)
Glucose, Bld: 104 mg/dL — ABNORMAL HIGH (ref 65–99)
Potassium: 4.1 mmol/L (ref 3.5–5.3)
Sodium: 140 mmol/L (ref 135–146)
Total Bilirubin: 0.6 mg/dL (ref 0.2–1.2)
Total Protein: 6.6 g/dL (ref 6.1–8.1)

## 2020-05-18 LAB — LIPID PANEL
Cholesterol: 202 mg/dL — ABNORMAL HIGH (ref <200)
HDL: 37 mg/dL — ABNORMAL LOW (ref >=50)
LDL Cholesterol (Calc): 128 mg/dL (calc) — ABNORMAL HIGH
Non-HDL Cholesterol (Calc): 165 mg/dL (calc) — ABNORMAL HIGH (ref <130)
Total CHOL/HDL Ratio: 5.5 (calc) — ABNORMAL HIGH (ref <5.0)
Triglycerides: 231 mg/dL — ABNORMAL HIGH (ref <150)

## 2020-05-18 LAB — HEMOGLOBIN A1C
Hgb A1c MFr Bld: 5.9 % of total Hgb — ABNORMAL HIGH (ref <5.7)
Mean Plasma Glucose: 123 mg/dL
eAG (mmol/L): 6.8 mmol/L

## 2020-05-18 MED ORDER — PRAVASTATIN SODIUM 20 MG PO TABS: 20.0000 mg | ORAL_TABLET | Freq: Every day | ORAL | 3 refills | Status: DC

## 2020-05-18 MED ORDER — LOSARTAN POTASSIUM 100 MG PO TABS
1.0000 | ORAL_TABLET | Freq: Every day | ORAL | 3 refills | Status: DC
Start: 2020-05-18 — End: 2021-05-10

## 2020-05-18 MED ORDER — HYDROCHLOROTHIAZIDE 25 MG PO TABS: 1.0000 | ORAL_TABLET | Freq: Every day | ORAL | 3 refills | Status: DC

## 2020-05-18 NOTE — Addendum Note (Signed)
Addended by: Phillips Odor on: 05/18/2020 04:05 PM   Modules accepted: Orders

## 2020-05-18 NOTE — Progress Notes (Signed)
Subjective:    Patient ID: Lori Swanson, female    DOB: [birth year], 65 y.o.   MRN: [id number]  HPI Patient is here today for complete physical exam.  She is a 65 year old female patient with a history of obesity, hypertension hyperlipidemia.  In 2016, she had cervical discitis and ultimately underwent vancomycin induced nephrotoxicity acute renal failure.  This is resolved completely and she is had no further issues.  She does complain of right-sided knee pain due to meniscal tear.  Patient is due for a Pap smear.  She is also overdue for colonoscopy.  She is due for a bone density test.  She is due for mammogram however she prefers to schedule this.  Her most recent lab work is listed below.  She is also due for Pneumovax 23. Lab on 05/17/2020  Component Date Value Ref Range Status  . Cholesterol 05/17/2020 202* <200 mg/dL Final  . HDL 19/62/2297 37* > OR = 50 mg/dL Final  . Triglycerides 05/17/2020 231* <150 mg/dL Final   Comment: . If a non-fasting specimen was collected, consider repeat triglyceride testing on a fasting specimen if clinically indicated.  Perry Mount et al. J. of Clin. Lipidol. 2015;9:129-169. .   . LDL Cholesterol (Calc) 05/17/2020 128* mg/dL (calc) Final   Comment: Reference range: <100 . Desirable range <100 mg/dL for primary prevention;   <70 mg/dL for patients with CHD or diabetic patients  with > or = 2 CHD risk factors. Marland Kitchen LDL-C is now calculated using the Martin-Hopkins  calculation, which is a validated novel method providing  better accuracy than the Friedewald equation in the  estimation of LDL-C.  Horald Pollen et al. Lenox Ahr. 9892;119(41): 2061-2068  (http://education.[facility].com/faq/FAQ164)   . Total CHOL/HDL Ratio 05/17/2020 5.5* <7.4 (calc) Final  . Non-HDL Cholesterol (Calc) 05/17/2020 165* <130 mg/dL (calc) Final   Comment: For patients with diabetes plus 1 major ASCVD risk  factor, treating to a non-HDL-C goal of <100 mg/dL  (LDL-C of <08  mg/dL) is considered a therapeutic  option.   . Hgb A1c MFr Bld 05/17/2020 5.9* <5.7 % of total Hgb Final   Comment: For someone without known diabetes, a hemoglobin  A1c value between 5.7% and 6.4% is consistent with prediabetes and should be confirmed with a  follow-up test. . For someone with known diabetes, a value <7% indicates that their diabetes is well controlled. A1c targets should be individualized based on duration of diabetes, age, comorbid conditions, and other considerations. . This assay result is consistent with an increased risk of diabetes. . Currently, no consensus exists regarding use of hemoglobin A1c for diagnosis of diabetes for children. .   . Mean Plasma Glucose 05/17/2020 123  mg/dL Final  . eAG (mmol/L) 14/48/1856 6.8  mmol/L Final  . Glucose, Bld 05/17/2020 104* 65 - 99 mg/dL Final   Comment: .            Fasting reference interval . For someone without known diabetes, a glucose value between 100 and 125 mg/dL is consistent with prediabetes and should be confirmed with a follow-up test. .   . BUN 05/17/2020 17  7 - 25 mg/dL Final  . Creat 31/49/7026 0.77  0.50 - 0.99 mg/dL Final   Comment: For patients >60 years of age, the reference limit for Creatinine is approximately 13% higher for people identified as African-American. .   . GFR, Est Non African American 05/17/2020 81  > OR = 60 mL/min/1.76m2 Final  . GFR, Est African American  05/17/2020 94  > OR = 60 mL/min/1.2673m2 Final  . BUN/Creatinine Ratio 05/17/2020 NOT APPLICABLE  6 - 22 (calc) Final  . Sodium 05/17/2020 140  135 - 146 mmol/L Final  . Potassium 05/17/2020 4.1  3.5 - 5.3 mmol/L Final  . Chloride 05/17/2020 101  98 - 110 mmol/L Final  . CO2 05/17/2020 28  20 - 32 mmol/L Final  . Calcium 05/17/2020 9.6  8.6 - 10.4 mg/dL Final  . Total Protein 05/17/2020 6.6  6.1 - 8.1 g/dL Final  . Albumin 56/21/308605/02/2020 4.3  3.6 - 5.1 g/dL Final  . Globulin 57/84/696205/02/2020 2.3  1.9 - 3.7 g/dL (calc)  Final  . AG Ratio 05/17/2020 1.9  1.0 - 2.5 (calc) Final  . Total Bilirubin 05/17/2020 0.6  0.2 - 1.2 mg/dL Final  . Alkaline phosphatase (APISO) 05/17/2020 56  37 - 153 U/L Final  . AST 05/17/2020 14  10 - 35 U/L Final  . ALT 05/17/2020 12  6 - 29 U/L Final  . WBC 05/17/2020 5.0  3.8 - 10.8 Thousand/uL Final  . RBC 05/17/2020 5.10  3.80 - 5.10 Million/uL Final  . Hemoglobin 05/17/2020 15.2  11.7 - 15.5 g/dL Final  . HCT 95/28/413205/02/2020 47.5* 35.0 - 45.0 % Final  . MCV 05/17/2020 93.1  80.0 - 100.0 fL Final  . MCH 05/17/2020 29.8  27.0 - 33.0 pg Final  . MCHC 05/17/2020 32.0  32.0 - 36.0 g/dL Final  . RDW 44/01/027205/02/2020 12.5  11.0 - 15.0 % Final  . Platelets 05/17/2020 317  140 - 400 Thousand/uL Final  . MPV 05/17/2020 10.3  7.5 - 12.5 fL Final  . Neutro Abs 05/17/2020 2,370  1,500 - 7,800 cells/uL Final  . Lymphs Abs 05/17/2020 1,980  850 - 3,900 cells/uL Final  . Absolute Monocytes 05/17/2020 480  200 - 950 cells/uL Final  . Eosinophils Absolute 05/17/2020 130  15 - 500 cells/uL Final  . Basophils Absolute 05/17/2020 40  0 - 200 cells/uL Final  . Neutrophils Relative % 05/17/2020 47.4  % Final  . Total Lymphocyte 05/17/2020 39.6  % Final  . Monocytes Relative 05/17/2020 9.6  % Final  . Eosinophils Relative 05/17/2020 2.6  % Final  . Basophils Relative 05/17/2020 0.8  % Final   Immunization History  Administered Date(s) Administered  . Influenza,inj,Quad PF,6+ Mos 09/09/2014, 10/28/2018  . PFIZER(Purple Top)SARS-COV-2 Vaccination 01/11/2020  . Td 01/16/2006     Past Medical History:  Diagnosis Date  . Acute renal failure ([facility]) 08/04/2014  . Colon polyps   . Detached retina 06/23/2008  . Hyperglycemia   . Hyperlipidemia   . Hypertension   . Obesity   . Right foot pain 09/09/2014  . Vancomycin-induced nephrotoxicity 08/04/2014   Past Surgical History:  Procedure Laterality Date  . BREAST BIOPSY Left    benign  . EYE SURGERY Left    cataract  . TONSILLECTOMY     Current  Outpatient Medications on File Prior to Visit  Medication Sig Dispense Refill  . aspirin 81 MG tablet Take 81 mg by mouth daily.    Marland Kitchen. atorvastatin (LIPITOR) 10 MG tablet TAKE 1 TABLET BY MOUTH EVERY DAY 90 tablet 1  . hydrochlorothiazide (HYDRODIURIL) 25 MG tablet TAKE 1 TABLET BY MOUTH EVERY DAY 90 tablet 2  . losartan (COZAAR) 100 MG tablet Take 1 tablet (100 mg total) by mouth daily. 30 tablet 0   No current facility-administered medications on file prior to visit.   Allergies  Allergen Reactions  .  Iodine Swelling  . Shellfish-Derived Products Swelling and Hives   Social History   Socioeconomic History  . Marital status: Single    Spouse name: Not on file  . Number of children: Not on file  . Years of education: Not on file  . Highest education level: Not on file  Occupational History  . Not on file  Tobacco Use  . Smoking status: Never Smoker  . Smokeless tobacco: Never Used  Substance and Sexual Activity  . Alcohol use: No  . Drug use: No  . Sexual activity: Not on file  Other Topics Concern  . Not on file  Social History Narrative  . Not on file   Social Determinants of Health   Financial Resource Strain: Not on file  Food Insecurity: Not on file  Transportation Needs: Not on file  Physical Activity: Not on file  Stress: Not on file  Social Connections: Not on file  Intimate Partner Violence: Not on file     Review of Systems  All other systems reviewed and are negative.      Objective:   Physical Exam Vitals reviewed. Exam conducted with a chaperone present.  Constitutional:      General: She is not in acute distress.    Appearance: Normal appearance. She is well-developed. She is not ill-appearing, toxic-appearing or diaphoretic.  HENT:     Head: Normocephalic and atraumatic.     Right Ear: Tympanic membrane and ear canal normal. There is no impacted cerumen.     Left Ear: Tympanic membrane and ear canal normal. There is no impacted cerumen.      Nose: Nose normal. No congestion or rhinorrhea.     Mouth/Throat:     Mouth: Mucous membranes are moist.     Pharynx: Oropharynx is clear. No oropharyngeal exudate or posterior oropharyngeal erythema.  Eyes:     General: No scleral icterus.       Right eye: No discharge.        Left eye: No discharge.     Extraocular Movements: Extraocular movements intact.     Conjunctiva/sclera: Conjunctivae normal.     Pupils: Pupils are equal, round, and reactive to light.  Neck:     Vascular: No carotid bruit.  Cardiovascular:     Rate and Rhythm: Normal rate and regular rhythm.     Heart sounds: Normal heart sounds. No murmur heard. No friction rub. No gallop.   Pulmonary:     Effort: Pulmonary effort is normal. No respiratory distress.     Breath sounds: Normal breath sounds. No stridor. No wheezing, rhonchi or rales.  Chest:     Chest wall: No tenderness.  Abdominal:     General: Bowel sounds are normal. There is no distension.     Palpations: Abdomen is soft. There is no mass.     Tenderness: There is no abdominal tenderness. There is no left CVA tenderness, guarding or rebound.     Hernia: No hernia is present.  Genitourinary:    Cervix: Normal.  Musculoskeletal:        General: No swelling or tenderness.     Cervical back: Normal range of motion and neck supple. No rigidity or tenderness.     Right lower leg: No edema.     Left lower leg: No edema.  Lymphadenopathy:     Cervical: No cervical adenopathy.  Skin:    Coloration: Skin is not jaundiced.     Findings: No bruising, erythema, lesion or rash.  Neurological:     General: No focal deficit present.     Mental Status: She is alert and oriented to person, place, and time. Mental status is at baseline.     Cranial Nerves: No cranial nerve deficit.     Sensory: No sensory deficit.     Motor: No weakness.     Coordination: Coordination normal.     Gait: Gait normal.     Deep Tendon Reflexes: Reflexes normal.  Psychiatric:         Mood and Affect: Mood normal.        Behavior: Behavior normal.        Thought Content: Thought content normal.        Judgment: Judgment normal.   Unable to palpate uterus or adnexa due to body habitus        Assessment & Plan:  Colon cancer screening - Plan: Ambulatory referral to Gastroenterology  Postmenopausal estrogen deficiency - Plan: DG Bone Density  Cervical cancer screening - Plan: PAP, Thin Prep w/HPV rflx HPV Type 16/18  Essential hypertension  Hyperlipidemia, unspecified hyperlipidemia type  General medical exam  Blood pressures acceptable today.  Cholesterol however is elevated.  Recommended pravastatin 20 mg a day.  Consult gastroenterology for colonoscopy.  Schedule the patient for a bone density test.  Patient will schedule her mammogram.  Pap smear was performed and sent to pathology in a labeled container.  Patient received Pneumovax 23 today.  I recommended Shingrix.  COVID shot is up-to-date.

## 2020-05-19 LAB — PAP, TP IMAGING W/ HPV RNA, RFLX HPV TYPE 16,18/45: HPV DNA High Risk: NOT DETECTED

## 2020-06-25 ENCOUNTER — Other Ambulatory Visit: Payer: Self-pay | Admitting: Family Medicine

## 2020-06-25 DIAGNOSIS — Z1231 Encounter for screening mammogram for malignant neoplasm of breast: Secondary | ICD-10-CM

## 2020-06-26 ENCOUNTER — Ambulatory Visit
Admission: RE | Admit: 2020-06-26 | Discharge: 2020-06-26 | Disposition: A | Discharge status: Home / Self Care | Payer: Commercial Managed Care - PPO | Source: Ambulatory Visit | Attending: Family Medicine | Admitting: Family Medicine

## 2020-06-26 DIAGNOSIS — Z1231 Encounter for screening mammogram for malignant neoplasm of breast: Secondary | ICD-10-CM

## 2020-07-08 ENCOUNTER — Other Ambulatory Visit: Payer: Commercial Managed Care - PPO

## 2020-07-09 ENCOUNTER — Other Ambulatory Visit: Payer: Self-pay

## 2020-07-09 ENCOUNTER — Ambulatory Visit
Admission: RE | Admit: 2020-07-09 | Discharge: 2020-07-09 | Disposition: A | Discharge status: Home / Self Care | Payer: Commercial Managed Care - PPO | Source: Ambulatory Visit | Attending: Family Medicine | Admitting: Family Medicine

## 2020-07-09 DIAGNOSIS — Z780 Asymptomatic menopausal state: Secondary | ICD-10-CM

## 2020-10-08 ENCOUNTER — Encounter: Payer: Self-pay | Admitting: Family Medicine

## 2021-05-10 ENCOUNTER — Other Ambulatory Visit: Payer: Self-pay | Admitting: Family Medicine

## 2021-05-10 DIAGNOSIS — I10 Essential (primary) hypertension: Secondary | ICD-10-CM

## 2021-06-16 ENCOUNTER — Other Ambulatory Visit: Payer: Self-pay | Admitting: Family Medicine

## 2021-06-16 NOTE — Telephone Encounter (Signed)
Requested medication (s) are due for refill today: yes  Requested medication (s) are on the active medication list: yes  Last refill:  05/18/20 #90 with 3 RF  Future visit scheduled: No, last visit 05/18/20  Notes to clinic: Failed protocol due to no valid visit within 12  months, and labs over 360 days, please assess.     Requested Prescriptions  Pending Prescriptions Disp Refills   hydrochlorothiazide (HYDRODIURIL) 25 MG tablet [Pharmacy Med Name: HYDROCHLOROTHIAZIDE 25 MG TAB] 90 tablet 3    Sig: Take 1 tablet (25 mg total) by mouth daily.     Cardiovascular: Diuretics - Thiazide Failed - 06/16/2021  2:19 AM      Failed - Cr in normal range and within 180 days    Creat  Date Value Ref Range Status  05/17/2020 0.77 0.50 - 0.99 mg/dL Final    Comment:    For patients >81 years of age, the reference limit for Creatinine is approximately 13% higher for people identified as African-American. .          Failed - K in normal range and within 180 days    Potassium  Date Value Ref Range Status  05/17/2020 4.1 3.5 - 5.3 mmol/L Final         Failed - Na in normal range and within 180 days    Sodium  Date Value Ref Range Status  05/17/2020 140 135 - 146 mmol/L Final         Failed - Valid encounter within last 6 months    Recent Outpatient Visits           1 year ago General medical exam   [facility] Donita Brooks, MD   2 years ago Essential hypertension   [facility] Tanya Nones, Priscille Heidelberg, MD   3 years ago Essential hypertension   [facility] Tanya Nones, Priscille Heidelberg, MD   4 years ago Essential hypertension   [facility] Dorena Bodo, [place]   4 years ago Essential hypertension   [facility] Allayne Butcher B, PA-C               Passed - Last BP in normal range    BP Readings from Last 1 Encounters:  05/18/20 134/82

## 2021-08-03 ENCOUNTER — Other Ambulatory Visit: Payer: Self-pay | Admitting: Family Medicine

## 2021-08-03 DIAGNOSIS — I10 Essential (primary) hypertension: Secondary | ICD-10-CM

## 2021-08-04 NOTE — Telephone Encounter (Signed)
Called pt - lmom to return call to make appt.

## 2021-08-04 NOTE — Telephone Encounter (Signed)
Requested medications are due for refill today.  yes  Requested medications are on the active medications list.  yes  Last refill. 05/10/2021 #390 0 refills for both  Future visit scheduled.   no  Notes to clinic.  Labs are expired, pt needs an appointment.    Requested Prescriptions  Pending Prescriptions Disp Refills   pravastatin (PRAVACHOL) 20 MG tablet [Pharmacy Med Name: PRAVASTATIN SODIUM 20 MG TAB] 90 tablet 0    Sig: Take 1 tablet (20 mg total) by mouth daily. PT NEEDS OV FOR FURTHER REFILLS     Cardiovascular:  Antilipid - Statins Failed - 08/03/2021  2:37 AM      Failed - Valid encounter within last 12 months    Recent Outpatient Visits           1 year ago General medical exam   [facility] Donita Brooks, MD   2 years ago Essential hypertension   [facility] Tanya Nones, Priscille Heidelberg, MD   3 years ago Essential hypertension   [facility] Tanya Nones, Priscille Heidelberg, MD   4 years ago Essential hypertension   [facility] Dorena Bodo, PA-C   4 years ago Essential hypertension   [facility] Allayne Butcher B, PA-C              Failed - Lipid Panel in normal range within the last 12 months    Cholesterol  Date Value Ref Range Status  05/17/2020 202 (H) <200 mg/dL Final   LDL Cholesterol (Calc)  Date Value Ref Range Status  05/17/2020 128 (H) mg/dL (calc) Final    Comment:    Reference range: <100 . Desirable range <100 mg/dL for primary prevention;   <70 mg/dL for patients with CHD or diabetic patients  with > or = 2 CHD risk factors. Marland Kitchen LDL-C is now calculated using the Martin-Hopkins  calculation, which is a validated novel method providing  better accuracy than the Friedewald equation in the  estimation of LDL-C.  Horald Pollen et al. Lenox Ahr. 6812;751(70): 2061-2068  (http://education.[facility].com/faq/FAQ164)    HDL  Date Value Ref Range Status  05/17/2020 37 (L) > OR = 50  mg/dL Final   Triglycerides  Date Value Ref Range Status  05/17/2020 231 (H) <150 mg/dL Final    Comment:    . If a non-fasting specimen was collected, consider repeat triglyceride testing on a fasting specimen if clinically indicated.  Perry Mount et al. J. of Clin. Lipidol. 2015;9:129-169. Marland Kitchen          Passed - Patient is not pregnant       losartan (COZAAR) 100 MG tablet [Pharmacy Med Name: LOSARTAN POTASSIUM 100 MG TAB] 90 tablet 0    Sig: TAKE 1 TABLET BY MOUTH DAILY     Cardiovascular:  Angiotensin Receptor Blockers Failed - 08/03/2021  2:37 AM      Failed - Cr in normal range and within 180 days    Creat  Date Value Ref Range Status  05/17/2020 0.77 0.50 - 0.99 mg/dL Final    Comment:    For patients >54 years of age, the reference limit for Creatinine is approximately 13% higher for people identified as African-American. .          Failed - K in normal range and within 180 days    Potassium  Date Value Ref Range Status  05/17/2020 4.1 3.5 - 5.3 mmol/L Final  Failed - Valid encounter within last 6 months    Recent Outpatient Visits           1 year ago General medical exam   [facility] Donita Brooks, MD   2 years ago Essential hypertension   [facility] Tanya Nones, Priscille Heidelberg, MD   3 years ago Essential hypertension   [facility] Tanya Nones, Priscille Heidelberg, MD   4 years ago Essential hypertension   [facility] Dorena Bodo, PA-C   4 years ago Essential hypertension   [facility] Dorena Bodo, [place]              Passed - Patient is not pregnant      Passed - Last BP in normal range    BP Readings from Last 1 Encounters:  05/18/20 134/82

## 2021-11-02 ENCOUNTER — Other Ambulatory Visit: Payer: Self-pay | Admitting: Family Medicine

## 2021-11-02 DIAGNOSIS — I10 Essential (primary) hypertension: Secondary | ICD-10-CM

## 2021-11-02 NOTE — Telephone Encounter (Signed)
Requested medication (s) are due for refill today: yes  Requested medication (s) are on the active medication list: yes  Last refill:  08/04/21 #90 with 0 RF for both meds, curtesy refills  Future visit scheduled: no, last seen 05/2020  Notes to clinic:  Labs and visit out of date, 05/2020, no upcoming appt already had curtesy refills, please assess.      Requested Prescriptions  Pending Prescriptions Disp Refills   pravastatin (PRAVACHOL) 20 MG tablet [Pharmacy Med Name: PRAVASTATIN SODIUM 20 MG TAB] 90 tablet 0    Sig: TAKE 1 TABLET (20 MG TOTAL) BY MOUTH DAILY. PT NEEDS OV FOR FURTHER REFILLS     Cardiovascular:  Antilipid - Statins Failed - 11/02/2021  1:53 AM      Failed - Valid encounter within last 12 months    Recent Outpatient Visits           1 year ago General medical exam   [facility] Donita Brooks, MD   2 years ago Essential hypertension   [facility] Tanya Nones, Priscille Heidelberg, MD   3 years ago Essential hypertension   [facility] Tanya Nones, Priscille Heidelberg, MD   5 years ago Essential hypertension   [facility] Allayne Butcher B, PA-C   5 years ago Essential hypertension   [facility] Allayne Butcher B, PA-C              Failed - Lipid Panel in normal range within the last 12 months    Cholesterol  Date Value Ref Range Status  05/17/2020 202 (H) <200 mg/dL Final   LDL Cholesterol (Calc)  Date Value Ref Range Status  05/17/2020 128 (H) mg/dL (calc) Final    Comment:    Reference range: <100 . Desirable range <100 mg/dL for primary prevention;   <70 mg/dL for patients with CHD or diabetic patients  with > or = 2 CHD risk factors. Marland Kitchen LDL-C is now calculated using the Martin-Hopkins  calculation, which is a validated novel method providing  better accuracy than the Friedewald equation in the  estimation of LDL-C.  Horald Pollen et al. Lenox Ahr. 9381;829(93): 2061-2068   (http://education.[facility].com/faq/FAQ164)    HDL  Date Value Ref Range Status  05/17/2020 37 (L) > OR = 50 mg/dL Final   Triglycerides  Date Value Ref Range Status  05/17/2020 231 (H) <150 mg/dL Final    Comment:    . If a non-fasting specimen was collected, consider repeat triglyceride testing on a fasting specimen if clinically indicated.  Perry Mount et al. J. of Clin. Lipidol. 2015;9:129-169. Marland Kitchen          Passed - Patient is not pregnant       losartan (COZAAR) 100 MG tablet [Pharmacy Med Name: LOSARTAN POTASSIUM 100 MG TAB] 90 tablet 0    Sig: TAKE 1 TABLET BY MOUTH EVERY DAY     Cardiovascular:  Angiotensin Receptor Blockers Failed - 11/02/2021  1:53 AM      Failed - Cr in normal range and within 180 days    Creat  Date Value Ref Range Status  05/17/2020 0.77 0.50 - 0.99 mg/dL Final    Comment:    For patients >82 years of age, the reference limit for Creatinine is approximately 13% higher for people identified as African-American. .          Failed - K in normal range and within 180 days    Potassium  Date Value  Ref Range Status  05/17/2020 4.1 3.5 - 5.3 mmol/L Final         Failed - Valid encounter within last 6 months    Recent Outpatient Visits           1 year ago General medical exam   [place] Susy Frizzle, MD   2 years ago Essential hypertension   [place] Dennard Schaumann, Cammie Mcgee, MD   3 years ago Essential hypertension   [place], Cammie Mcgee, MD   5 years ago Essential hypertension   [place], Mary B, PA-C   5 years ago Essential hypertension   [place], Mary B, [place]              Passed - Patient is not pregnant      Passed - Last BP in normal range    BP Readings from Last 1 Encounters:  05/18/20 134/82

## 2021-11-23 ENCOUNTER — Other Ambulatory Visit: Payer: Self-pay | Admitting: Family Medicine

## 2021-11-23 DIAGNOSIS — Z1231 Encounter for screening mammogram for malignant neoplasm of breast: Secondary | ICD-10-CM

## 2022-01-25 ENCOUNTER — Other Ambulatory Visit: Payer: Self-pay | Admitting: Family Medicine

## 2022-01-25 NOTE — Telephone Encounter (Signed)
Requested medication (s) are due for refill today - yes  Requested medication (s) are on the active medication list -yes  Future visit scheduled -no  Last refill: 11/02/21 #90  Notes to clinic: Attempted to call patient to schedule appointment- overdue- left message to call office, fails lab protocol, last RF has notes- sent for review   Requested Prescriptions  Pending Prescriptions Disp Refills   pravastatin (PRAVACHOL) 20 MG tablet [Pharmacy Med Name: PRAVASTATIN SODIUM 20 MG TAB] 90 tablet 0    Sig: TAKE 1 TABLET (20 MG TOTAL) BY MOUTH DAILY. PT NEEDS OV FOR FURTHER REFILLS     Cardiovascular:  Antilipid - Statins Failed - 01/25/2022  8:33 AM      Failed - Valid encounter within last 12 months    Recent Outpatient Visits           1 year ago General medical exam   [place] Susy Frizzle, MD   2 years ago Essential hypertension   [place] Dennard Schaumann, Cammie Mcgee, MD   4 years ago Essential hypertension   [place], Cammie Mcgee, MD   5 years ago Essential hypertension   [place], Mary B, PA-C   5 years ago Essential hypertension   [place], Mary B, PA-C              Failed - Lipid Panel in normal range within the last 12 months    Cholesterol  Date Value Ref Range Status  05/17/2020 202 (H) <200 mg/dL Final   LDL Cholesterol (Calc)  Date Value Ref Range Status  05/17/2020 128 (H) mg/dL (calc) Final    Comment:    Reference range: <100 . Desirable range <100 mg/dL for primary prevention;   <70 mg/dL for patients with CHD or diabetic patients  with > or = 2 CHD risk factors. Marland Kitchen LDL-C is now calculated using the Martin-Hopkins  calculation, which is a validated novel method providing  better accuracy than the Friedewald equation in the  estimation of LDL-C.  Cresenciano Genre et al. Annamaria Helling. 0623;762(83): 2061-2068   (http://education.[facility].com/faq/FAQ164)    HDL  Date Value Ref Range Status  05/17/2020 37 (L) > OR = 50 mg/dL Final   Triglycerides  Date Value Ref Range Status  05/17/2020 231 (H) <150 mg/dL Final    Comment:    . If a non-fasting specimen was collected, consider repeat triglyceride testing on a fasting specimen if clinically indicated.  Yates Decamp et al. J. of Clin. Lipidol. 1517;6:160-737. Marland Kitchen          Passed - Patient is not pregnant         Requested Prescriptions  Pending Prescriptions Disp Refills   pravastatin (PRAVACHOL) 20 MG tablet [Pharmacy Med Name: PRAVASTATIN SODIUM 20 MG TAB] 90 tablet 0    Sig: TAKE 1 TABLET (20 MG TOTAL) BY MOUTH DAILY. PT NEEDS OV FOR FURTHER REFILLS     Cardiovascular:  Antilipid - Statins Failed - 01/25/2022  8:33 AM      Failed - Valid encounter within last 12 months    Recent Outpatient Visits           1 year ago General medical exam   [place] Susy Frizzle, MD   2 years ago Essential hypertension   [place], Cammie Mcgee, MD   4 years ago Essential hypertension   [place],  Cammie Mcgee, MD   5 years ago Essential hypertension   [place], Mary B, [place]   5 years ago Essential hypertension   [place], Mary B, PA-C              Failed - Lipid Panel in normal range within the last 12 months    Cholesterol  Date Value Ref Range Status  05/17/2020 202 (H) <200 mg/dL Final   LDL Cholesterol (Calc)  Date Value Ref Range Status  05/17/2020 128 (H) mg/dL (calc) Final    Comment:    Reference range: <100 . Desirable range <100 mg/dL for primary prevention;   <70 mg/dL for patients with CHD or diabetic patients  with > or = 2 CHD risk factors. Marland Kitchen LDL-C is now calculated using the Martin-Hopkins  calculation, which is a validated novel method providing  better accuracy than the Friedewald  equation in the  estimation of LDL-C.  Cresenciano Genre et al. Annamaria Helling. 6606;301(60): 2061-2068  (http://education.[facility].com/faq/FAQ164)    HDL  Date Value Ref Range Status  05/17/2020 37 (L) > OR = 50 mg/dL Final   Triglycerides  Date Value Ref Range Status  05/17/2020 231 (H) <150 mg/dL Final    Comment:    . If a non-fasting specimen was collected, consider repeat triglyceride testing on a fasting specimen if clinically indicated.  Yates Decamp et al. J. of Clin. Lipidol. 1093;2:355-732. Marland Kitchen          Passed - Patient is not pregnant

## 2022-01-29 ENCOUNTER — Other Ambulatory Visit: Payer: Self-pay | Admitting: Family Medicine

## 2022-01-29 DIAGNOSIS — I10 Essential (primary) hypertension: Secondary | ICD-10-CM

## 2022-01-30 NOTE — Telephone Encounter (Signed)
Requested Prescriptions  Pending Prescriptions Disp Refills   losartan (COZAAR) 100 MG tablet [Pharmacy Med Name: LOSARTAN POTASSIUM 100 MG TAB] 90 tablet 0    Sig: TAKE 1 TABLET BY MOUTH EVERY DAY     Cardiovascular:  Angiotensin Receptor Blockers Failed - 01/29/2022  8:37 AM      Failed - Cr in normal range and within 180 days    Creat  Date Value Ref Range Status  05/17/2020 0.77 0.50 - 0.99 mg/dL Final    Comment:    For patients >67 years of age, the reference limit for Creatinine is approximately 13% higher for people identified as African-American. .          Failed - K in normal range and within 180 days    Potassium  Date Value Ref Range Status  05/17/2020 4.1 3.5 - 5.3 mmol/L Final         Failed - Valid encounter within last 6 months    Recent Outpatient Visits           1 year ago General medical exam   [place] Susy Frizzle, MD   3 years ago Essential hypertension   [place] Dennard Schaumann, Cammie Mcgee, MD   4 years ago Essential hypertension   [place], Cammie Mcgee, MD   5 years ago Essential hypertension   [place], Mary B, [place]   5 years ago Essential hypertension   [place], Mary B, [place]              Passed - Patient is not pregnant      Passed - Last BP in normal range    BP Readings from Last 1 Encounters:  05/18/20 134/82

## 2022-04-27 ENCOUNTER — Other Ambulatory Visit: Payer: Self-pay | Admitting: Family Medicine

## 2022-04-27 DIAGNOSIS — I10 Essential (primary) hypertension: Secondary | ICD-10-CM

## 2022-04-27 NOTE — Telephone Encounter (Signed)
Requested medications are due for refill today.  yes  Requested medications are on the active medications list.  yes  Last refill. 01/30/2022 #90 0 rf  Future visit scheduled.   No   Notes to clinic.  Pt is more than 3 months overdue for OV. PT last seen 05/2020   Requested Prescriptions  Pending Prescriptions Disp Refills   losartan (COZAAR) 100 MG tablet [Pharmacy Med Name: LOSARTAN POTASSIUM 100 MG TAB] 90 tablet 0    Sig: TAKE 1 TABLET BY MOUTH EVERY DAY     Cardiovascular:  Angiotensin Receptor Blockers Failed - 04/27/2022  1:48 AM      Failed - Cr in normal range and within 180 days    Creat  Date Value Ref Range Status  05/17/2020 0.77 0.50 - 0.99 mg/dL Final    Comment:    For patients >76 years of age, the reference limit for Creatinine is approximately 13% higher for people identified as African-American. .          Failed - K in normal range and within 180 days    Potassium  Date Value Ref Range Status  05/17/2020 4.1 3.5 - 5.3 mmol/L Final         Failed - Valid encounter within last 6 months    Recent Outpatient Visits           1 year ago General medical exam   [facility] Donita Brooks, MD   3 years ago Essential hypertension   [facility] Tanya Nones, Priscille Heidelberg, MD   4 years ago Essential hypertension   [facility] Tanya Nones, Priscille Heidelberg, MD   5 years ago Essential hypertension   [facility] Dorena Bodo, [place]   5 years ago Essential hypertension   [facility] Dorena Bodo, [place]              Passed - Patient is not pregnant      Passed - Last BP in normal range    BP Readings from Last 1 Encounters:  05/18/20 134/82

## 2022-06-29 ENCOUNTER — Other Ambulatory Visit: Payer: Self-pay | Admitting: Family Medicine

## 2022-06-29 NOTE — Telephone Encounter (Signed)
Requested Prescriptions  Pending Prescriptions Disp Refills   hydrochlorothiazide (HYDRODIURIL) 25 MG tablet [Pharmacy Med Name: HYDROCHLOROTHIAZIDE 25 MG TAB] 90 tablet 0    Sig: TAKE 1 TABLET (25 MG TOTAL) BY MOUTH DAILY.     Cardiovascular: Diuretics - Thiazide Failed - 06/29/2022  2:33 AM      Failed - Cr in normal range and within 180 days    Creat  Date Value Ref Range Status  05/17/2020 0.77 0.50 - 0.99 mg/dL Final    Comment:    For patients >78 years of age, the reference limit for Creatinine is approximately 13% higher for people identified as African-American. .          Failed - K in normal range and within 180 days    Potassium  Date Value Ref Range Status  05/17/2020 4.1 3.5 - 5.3 mmol/L Final         Failed - Na in normal range and within 180 days    Sodium  Date Value Ref Range Status  05/17/2020 140 135 - 146 mmol/L Final         Failed - Valid encounter within last 6 months    Recent Outpatient Visits           2 years ago General medical exam   [facility] Donita Brooks, MD   3 years ago Essential hypertension   [facility] Tanya Nones, Priscille Heidelberg, MD   4 years ago Essential hypertension   [facility] Tanya Nones, Priscille Heidelberg, MD   5 years ago Essential hypertension   [facility] Dorena Bodo, PA-C   5 years ago Essential hypertension   [facility] Allayne Butcher B, PA-C              Passed - Last BP in normal range    BP Readings from Last 1 Encounters:  05/18/20 134/82

## 2022-07-28 ENCOUNTER — Other Ambulatory Visit: Payer: Self-pay | Admitting: Family Medicine

## 2022-07-28 DIAGNOSIS — I10 Essential (primary) hypertension: Secondary | ICD-10-CM

## 2022-07-28 NOTE — Telephone Encounter (Signed)
Requested medication (s) are due for refill today:yes  Requested medication (s) are on the active medication list: yes  Last refill:  04/27/22 #90  Future visit scheduled:no   Notes to clinic:  NT not delegated to make appt for CPE- needs OV/overdue lab work    Requested Prescriptions  Pending Prescriptions Disp Refills   losartan (COZAAR) 100 MG tablet [Pharmacy Med Name: LOSARTAN POTASSIUM 100 MG TAB] 90 tablet 0    Sig: TAKE 1 TABLET BY MOUTH EVERY DAY     Cardiovascular:  Angiotensin Receptor Blockers Failed - 07/28/2022  2:29 AM      Failed - Cr in normal range and within 180 days    Creat  Date Value Ref Range Status  05/17/2020 0.77 0.50 - 0.99 mg/dL Final    Comment:    For patients >67 years of age, the reference limit for Creatinine is approximately 13% higher for people identified as African-American. .          Failed - K in normal range and within 180 days    Potassium  Date Value Ref Range Status  05/17/2020 4.1 3.5 - 5.3 mmol/L Final         Failed - Valid encounter within last 6 months    Recent Outpatient Visits           2 years ago General medical exam   [facility] Donita Brooks, MD   3 years ago Essential hypertension   [facility] Tanya Nones, Priscille Heidelberg, MD   4 years ago Essential hypertension   [facility] Tanya Nones, Priscille Heidelberg, MD   5 years ago Essential hypertension   [facility] Dorena Bodo, [place]   5 years ago Essential hypertension   [facility] Dorena Bodo, [place]              Passed - Patient is not pregnant      Passed - Last BP in normal range    BP Readings from Last 1 Encounters:  05/18/20 134/82

## 2022-09-26 ENCOUNTER — Other Ambulatory Visit: Payer: Self-pay | Admitting: Family Medicine

## 2022-09-27 NOTE — Telephone Encounter (Signed)
Last OV 06/07/20, OV needed for additional refills.  Requested Prescriptions  Pending Prescriptions Disp Refills   hydrochlorothiazide (HYDRODIURIL) 25 MG tablet [Pharmacy Med Name: HYDROCHLOROTHIAZIDE 25 MG TAB] 90 tablet 0    Sig: TAKE 1 TABLET (25 MG TOTAL) BY MOUTH DAILY.     Cardiovascular: Diuretics - Thiazide Failed - 09/26/2022  2:39 AM      Failed - Cr in normal range and within 180 days    Creat  Date Value Ref Range Status  05/17/2020 0.77 0.50 - 0.99 mg/dL Final    Comment:    For patients >53 years of age, the reference limit for Creatinine is approximately 13% higher for people identified as African-American. .          Failed - K in normal range and within 180 days    Potassium  Date Value Ref Range Status  05/17/2020 4.1 3.5 - 5.3 mmol/L Final         Failed - Na in normal range and within 180 days    Sodium  Date Value Ref Range Status  05/17/2020 140 135 - 146 mmol/L Final         Failed - Valid encounter within last 6 months    Recent Outpatient Visits           2 years ago General medical exam   [facility] Donita Brooks, MD   3 years ago Essential hypertension   [facility] Tanya Nones, Priscille Heidelberg, MD   4 years ago Essential hypertension   [facility] Tanya Nones, Priscille Heidelberg, MD   5 years ago Essential hypertension   [facility] Dorena Bodo, PA-C   6 years ago Essential hypertension   [facility] Medicine Allayne Butcher B, PA-C              Passed - Last BP in normal range    BP Readings from Last 1 Encounters:  05/18/20 134/82

## 2022-10-25 ENCOUNTER — Other Ambulatory Visit: Payer: Self-pay | Admitting: Family Medicine

## 2022-10-25 DIAGNOSIS — I10 Essential (primary) hypertension: Secondary | ICD-10-CM

## 2022-10-25 NOTE — Telephone Encounter (Signed)
Requested medication (s) are due for refill today: Yes  Requested medication (s) are on the active medication list: Yes  Last refill:  07/31/22 #90, 0RF  Future visit scheduled: No  Notes to clinic:  Unable to refill per protocol, appointment needed.      Requested Prescriptions  Pending Prescriptions Disp Refills   losartan (COZAAR) 100 MG tablet [Pharmacy Med Name: LOSARTAN POTASSIUM 100 MG TAB] 90 tablet 0    Sig: TAKE 1 TABLET BY MOUTH EVERY DAY     Cardiovascular:  Angiotensin Receptor Blockers Failed - 10/25/2022  1:45 AM      Failed - Cr in normal range and within 180 days    Creat  Date Value Ref Range Status  05/17/2020 0.77 0.50 - 0.99 mg/dL Final    Comment:    For patients >28 years of age, the reference limit for Creatinine is approximately 13% higher for people identified as African-American. .          Failed - K in normal range and within 180 days    Potassium  Date Value Ref Range Status  05/17/2020 4.1 3.5 - 5.3 mmol/L Final         Failed - Valid encounter within last 6 months    Recent Outpatient Visits           2 years ago General medical exam   [facility] Donita Brooks, MD   3 years ago Essential hypertension   [facility] Tanya Nones, Priscille Heidelberg, [place]   4 years ago Essential hypertension   [facility] Tanya Nones, Priscille Heidelberg, [place]   6 years ago Essential hypertension   [facility] Dorena Bodo, [place]   6 years ago Essential hypertension   [facility] Medicine Dorena Bodo, [place]              Passed - Patient is not pregnant      Passed - Last BP in normal range    BP Readings from Last 1 Encounters:  05/18/20 134/82

## 2022-10-31 ENCOUNTER — Ambulatory Visit: Payer: Commercial Managed Care - PPO | Admitting: Family Medicine

## 2022-10-31 ENCOUNTER — Encounter: Payer: Self-pay | Admitting: Family Medicine

## 2022-10-31 VITALS — BP 144/86 | HR 74 | Temp 97.7°F | Ht 63.0 in | Wt 290.2 lb

## 2022-10-31 DIAGNOSIS — Z1231 Encounter for screening mammogram for malignant neoplasm of breast: Secondary | ICD-10-CM

## 2022-10-31 DIAGNOSIS — Z1211 Encounter for screening for malignant neoplasm of colon: Secondary | ICD-10-CM | POA: Diagnosis not present

## 2022-10-31 DIAGNOSIS — I10 Essential (primary) hypertension: Secondary | ICD-10-CM

## 2022-10-31 DIAGNOSIS — L304 Erythema intertrigo: Secondary | ICD-10-CM | POA: Insufficient documentation

## 2022-10-31 DIAGNOSIS — E7800 Pure hypercholesterolemia, unspecified: Secondary | ICD-10-CM | POA: Diagnosis not present

## 2022-10-31 MED ORDER — HYDROCHLOROTHIAZIDE 25 MG PO TABS: 25.0000 mg | ORAL_TABLET | Freq: Every day | ORAL | 3 refills | Status: DC

## 2022-10-31 MED ORDER — LOSARTAN POTASSIUM 100 MG PO TABS: 100.0000 mg | ORAL_TABLET | Freq: Every day | ORAL | 3 refills | Status: DC

## 2022-10-31 NOTE — Progress Notes (Signed)
Subjective:    Patient ID: Lori Swanson, female    DOB: 1955-10-01, 67 y.o.   MRN: [id number]  HPI  Patient is here today for medicine check.  She has a history of hypertension as well as hyperlipidemia.  She is still taking her medication however her pravastatin ran out several months ago.  Therefore she has not been taking any medication over the last several months.  She is due for a flu shot which she is planning on getting at work.  She is due for shingles vaccine which she defers today.  She is due for a COVID booster.  She is long overdue for a mammogram as well as a colonoscopy.  Patient agrees to allow me to schedule this today.  Otherwise she is doing well aside from some knee pain.  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     hydrochlorothiazide (HYDRODIURIL) 25 MG tablet TAKE 1 TABLET (25 MG TOTAL) BY MOUTH DAILY. 90 tablet 0   losartan (COZAAR) 100 MG tablet TAKE 1 TABLET BY MOUTH EVERY DAY 90 tablet 0   pravastatin (PRAVACHOL) 20 MG tablet TAKE 1 TABLET (20 MG TOTAL) BY MOUTH DAILY. PT NEEDS OV FOR FURTHER REFILLS (Patient not taking: Reported on 10/31/2022) 90 tablet 0   No current facility-administered medications on file prior to visit.   Allergies  Allergen Reactions   Iodine Swelling   Shellfish-Derived Products Swelling and Hives   Social History   Socioeconomic History   Marital status: Single    Spouse name: Not on file   Number of children: Not on file   Years of  education: Not on file   Highest education level: Bachelor's degree (e.g., BA, AB, BS)  Occupational History   Not on file  Tobacco Use   Smoking status: Never   Smokeless tobacco: Never  Substance and Sexual Activity   Alcohol use: No   Drug use: No   Sexual activity: Not on file  Other Topics Concern   Not on file  Social History Narrative   Not on file   Social Determinants of Health   Financial Resource Strain: Low Risk  (10/29/2022)   Overall Financial Resource Strain (CARDIA)    Difficulty of Paying Living Expenses: Not hard at all  Food Insecurity: No Food Insecurity (10/29/2022)   Hunger Vital Sign    Worried About Running Out of Food in the Last Year: Never true    Ran Out of Food in the Last Year: Never true  Transportation Needs: No Transportation Needs (10/29/2022)   PRAPARE - Administrator, Civil Service (Medical): No    Lack of Transportation (Non-Medical): No  Physical Activity: Unknown (10/29/2022)   Exercise Vital Sign    Days of Exercise per Week: 0 days    Minutes of Exercise per Session: Not on file  Stress: No Stress Concern Present (10/29/2022)  [employer] of Occupational Health - Occupational Stress Questionnaire    Feeling of Stress : Only a little  Social Connections: Socially Isolated (10/29/2022)   Social Connection and Isolation Panel [NHANES]    Frequency of Communication with Friends and Family: More than three times a week    Frequency of Social Gatherings with Friends and Family: More than three times a week    Attends Religious Services: Never    Database administrator or Organizations: No    Attends Engineer, structural: Not on file    Marital Status: Never married  Intimate Partner Violence: Not on file     Review of Systems  All other systems reviewed and are negative.      Objective:   Physical Exam Vitals reviewed. Exam conducted with a chaperone present.  Constitutional:      General: She is  not in acute distress.    Appearance: Normal appearance. She is well-developed. She is not ill-appearing, toxic-appearing or diaphoretic.  HENT:     Head: Normocephalic and atraumatic.     Right Ear: Tympanic membrane and ear canal normal. There is no impacted cerumen.     Left Ear: Tympanic membrane and ear canal normal. There is no impacted cerumen.     Nose: Nose normal. No congestion or rhinorrhea.     Mouth/Throat:     Mouth: Mucous membranes are moist.     Pharynx: Oropharynx is clear. No oropharyngeal exudate or posterior oropharyngeal erythema.  Eyes:     General: No scleral icterus.       Right eye: No discharge.        Left eye: No discharge.     Extraocular Movements: Extraocular movements intact.     Conjunctiva/sclera: Conjunctivae normal.     Pupils: Pupils are equal, round, and reactive to light.  Neck:     Vascular: No carotid bruit.  Cardiovascular:     Rate and Rhythm: Normal rate and regular rhythm.     Heart sounds: Normal heart sounds. No murmur heard.    No friction rub. No gallop.  Pulmonary:     Effort: Pulmonary effort is normal. No respiratory distress.     Breath sounds: Normal breath sounds. No stridor. No wheezing, rhonchi or rales.  Chest:     Chest wall: No tenderness.  Abdominal:     General: Bowel sounds are normal. There is no distension.     Palpations: Abdomen is soft. There is no mass.     Tenderness: There is no abdominal tenderness. There is no left CVA tenderness, guarding or rebound.     Hernia: No hernia is present.  Genitourinary:    Cervix: Normal.  Musculoskeletal:        General: No swelling or tenderness.     Cervical back: Normal range of motion and neck supple. No rigidity or tenderness.     Right lower leg: No edema.     Left lower leg: No edema.  Lymphadenopathy:     Cervical: No cervical adenopathy.  Skin:    Coloration: Skin is not jaundiced.     Findings: No bruising, erythema, lesion or rash.  Neurological:      General: No focal deficit present.     Mental Status: She is alert and oriented to person, place, and time. Mental status is at baseline.     Cranial Nerves: No cranial nerve deficit.     Sensory: No sensory deficit.     Motor: No weakness.  Coordination: Coordination normal.     Gait: Gait normal.     Deep Tendon Reflexes: Reflexes normal.  Psychiatric:        Mood and Affect: Mood normal.        Behavior: Behavior normal.        Thought Content: Thought content normal.        Judgment: Judgment normal.   Unable to palpate uterus or adnexa due to body habitus        Assessment & Plan:  Pure hypercholesterolemia - Plan: CBC with Differential/Platelet, COMPLETE METABOLIC PANEL WITH GFR, Lipid panel  Encounter for screening mammogram for malignant neoplasm of breast - Plan: MM Digital Screening  Colon cancer screening - Plan: Ambulatory referral to Gastroenterology  Primary hypertension Blood pressure here today is elevated however the patient states she checks it at home it is consistently less than 140/90.  Patient defers her flu shot today.  She defers the shingles vaccine.  She defers a COVID booster.  I will schedule the patient for a colonoscopy.  I will schedule her for a mammogram.  I will check a fasting lipid panel.  If LDL cholesterol is greater than 130 I plan to resume pravastatin.  We also discussed Wegovy for weight loss.  The patient will research this and let me know what her decision is.

## 2022-11-01 LAB — COMPLETE METABOLIC PANEL WITH GFR
AG Ratio: 1.6 (calc) (ref 1.0–2.5)
ALT: 11 U/L (ref 6–29)
AST: 12 U/L (ref 10–35)
Albumin: 4.1 g/dL (ref 3.6–5.1)
Alkaline phosphatase (APISO): 63 U/L (ref 37–153)
BUN: 16 mg/dL (ref 7–25)
CO2: 28 mmol/L (ref 20–32)
Calcium: 9.8 mg/dL (ref 8.6–10.4)
Chloride: 101 mmol/L (ref 98–110)
Creat: 0.83 mg/dL (ref 0.50–1.05)
Globulin: 2.6 g/dL (ref 1.9–3.7)
Glucose, Bld: 110 mg/dL — ABNORMAL HIGH (ref 65–99)
Potassium: 4 mmol/L (ref 3.5–5.3)
Sodium: 140 mmol/L (ref 135–146)
Total Bilirubin: 0.6 mg/dL (ref 0.2–1.2)
Total Protein: 6.7 g/dL (ref 6.1–8.1)
eGFR: 77 mL/min/{1.73_m2} (ref >=60)

## 2022-11-01 LAB — CBC WITH DIFFERENTIAL/PLATELET
Absolute Lymphocytes: 2207 {cells}/uL (ref 850–3900)
Absolute Monocytes: 531 {cells}/uL (ref 200–950)
Basophils Absolute: 47 {cells}/uL (ref 0–200)
Basophils Relative: 0.8 %
Eosinophils Absolute: 148 {cells}/uL (ref 15–500)
Eosinophils Relative: 2.5 %
HCT: 45.4 % — ABNORMAL HIGH (ref 35.0–45.0)
Hemoglobin: 14.9 g/dL (ref 11.7–15.5)
MCH: 29.9 pg (ref 27.0–33.0)
MCHC: 32.8 g/dL (ref 32.0–36.0)
MCV: 91 fL (ref 80.0–100.0)
MPV: 9.9 fL (ref 7.5–12.5)
Monocytes Relative: 9 %
Neutro Abs: 2968 {cells}/uL (ref 1500–7800)
Neutrophils Relative %: 50.3 %
Platelets: 344 10*3/uL (ref 140–400)
RBC: 4.99 10*6/uL (ref 3.80–5.10)
RDW: 12.1 % (ref 11.0–15.0)
Total Lymphocyte: 37.4 %
WBC: 5.9 10*3/uL (ref 3.8–10.8)

## 2022-11-01 LAB — LIPID PANEL
Cholesterol: 219 mg/dL — ABNORMAL HIGH (ref <200)
HDL: 42 mg/dL — ABNORMAL LOW (ref >=50)
LDL Cholesterol (Calc): 140 mg/dL — ABNORMAL HIGH
Non-HDL Cholesterol (Calc): 177 mg/dL — ABNORMAL HIGH (ref <130)
Total CHOL/HDL Ratio: 5.2 (calc) — ABNORMAL HIGH (ref <5.0)
Triglycerides: 228 mg/dL — ABNORMAL HIGH (ref <150)

## 2022-11-02 ENCOUNTER — Other Ambulatory Visit: Payer: Self-pay

## 2022-11-02 MED ORDER — PRAVASTATIN SODIUM 20 MG PO TABS: 20.0000 mg | ORAL_TABLET | Freq: Every day | ORAL | 0 refills | Status: DC

## 2022-11-02 MED ORDER — WEGOVY 0.5 MG/0.5ML ~~LOC~~ SOAJ: 0.5000 mg | SUBCUTANEOUS | 0 refills | Status: AC

## 2022-11-20 ENCOUNTER — Ambulatory Visit
Admission: RE | Admit: 2022-11-20 | Discharge: 2022-11-20 | Disposition: A | Discharge status: Home / Self Care | Payer: Commercial Managed Care - PPO | Source: Ambulatory Visit | Attending: Family Medicine | Admitting: Family Medicine

## 2022-11-20 DIAGNOSIS — Z1231 Encounter for screening mammogram for malignant neoplasm of breast: Secondary | ICD-10-CM

## 2022-12-21 ENCOUNTER — Telehealth: Payer: Self-pay

## 2022-12-21 NOTE — Telephone Encounter (Signed)
PA-Wegovy has been DENIED

## 2023-01-25 ENCOUNTER — Other Ambulatory Visit: Payer: Self-pay | Admitting: Family Medicine

## 2023-04-23 ENCOUNTER — Other Ambulatory Visit: Payer: Self-pay | Admitting: Family Medicine

## 2023-04-24 NOTE — Telephone Encounter (Signed)
 Requested medication (s) are due for refill today: Yes  Requested medication (s) are on the active medication list: Yes  Last refill:  01/25/23  Future visit scheduled: no  Notes to clinic:  Pt. Declines to make appointment at this time.    Requested Prescriptions  Pending Prescriptions Disp Refills   pravastatin (PRAVACHOL) 20 MG tablet [Pharmacy Med Name: PRAVASTATIN SODIUM 20 MG TAB] 90 tablet 0    Sig: TAKE 1 TABLET (20 MG TOTAL) BY MOUTH DAILY. PT NEEDS OV FOR FURTHER REFILLS     Cardiovascular:  Antilipid - Statins Failed - 04/24/2023  2:19 PM      Failed - Lipid Panel in normal range within the last 12 months    Cholesterol  Date Value Ref Range Status  10/31/2022 219 (H) <200 mg/dL Final   LDL Cholesterol (Calc)  Date Value Ref Range Status  10/31/2022 140 (H) mg/dL (calc) Final    Comment:    Reference range: <100 . Desirable range <100 mg/dL for primary prevention;   <70 mg/dL for patients with CHD or diabetic patients  with > or = 2 CHD risk factors. Marland Kitchen LDL-C is now calculated using the Martin-Hopkins  calculation, which is a validated novel method providing  better accuracy than the Friedewald equation in the  estimation of LDL-C.  Horald Pollen et al. Lenox Ahr. 3244;010(27): 2061-2068  (http://education.[facility].com/faq/FAQ164)    HDL  Date Value Ref Range Status  10/31/2022 42 (L) > OR = 50 mg/dL Final   Triglycerides  Date Value Ref Range Status  10/31/2022 228 (H) <150 mg/dL Final    Comment:    . If a non-fasting specimen was collected, consider repeat triglyceride testing on a fasting specimen if clinically indicated.  Perry Mount et al. J. of Clin. Lipidol. 2015;9:129-169. Marland Kitchen          Passed - Patient is not pregnant      Passed - Valid encounter within last 12 months    Recent Outpatient Visits           5 months ago Pure hypercholesterolemia   [facility] [place], Priscille Heidelberg, MD

## 2023-07-25 ENCOUNTER — Other Ambulatory Visit: Payer: Self-pay | Admitting: Family Medicine

## 2023-10-21 ENCOUNTER — Other Ambulatory Visit: Payer: Self-pay | Admitting: Family Medicine

## 2023-10-21 DIAGNOSIS — I10 Essential (primary) hypertension: Secondary | ICD-10-CM

## 2023-10-22 ENCOUNTER — Other Ambulatory Visit: Payer: Self-pay | Admitting: Family Medicine

## 2024-01-17 ENCOUNTER — Other Ambulatory Visit: Payer: Self-pay | Admitting: Family Medicine
